# Patient Record
Sex: Female | Born: 1955 | Race: White | Hispanic: No | Smoking: Never smoker
Health system: Southern US, Community
[De-identification: ages and names within clinical notes are randomized; demographics above are authoritative.]

## PROBLEM LIST (undated history)

## (undated) DIAGNOSIS — I5020 Unspecified systolic (congestive) heart failure: Secondary | ICD-10-CM

## (undated) DIAGNOSIS — I214 Non-ST elevation (NSTEMI) myocardial infarction: Secondary | ICD-10-CM

## (undated) DIAGNOSIS — E7800 Pure hypercholesterolemia, unspecified: Secondary | ICD-10-CM

## (undated) DIAGNOSIS — E039 Hypothyroidism, unspecified: Secondary | ICD-10-CM

## (undated) DIAGNOSIS — G4733 Obstructive sleep apnea (adult) (pediatric): Secondary | ICD-10-CM

## (undated) DIAGNOSIS — F32A Depression, unspecified: Secondary | ICD-10-CM

## (undated) DIAGNOSIS — Z9989 Dependence on other enabling machines and devices: Secondary | ICD-10-CM

## (undated) DIAGNOSIS — E079 Disorder of thyroid, unspecified: Secondary | ICD-10-CM

## (undated) HISTORY — PX: JOINT REPLACEMENT: SHX530

## (undated) HISTORY — PX: EYE SURGERY: SHX253

## (undated) HISTORY — PX: CARDIAC CATHETERIZATION: SHX172

## (undated) HISTORY — PX: CARPAL TUNNEL RELEASE: SHX101

## (undated) HISTORY — PX: SEPTOPLASTY: SUR1290

## (undated) HISTORY — PX: ARTHROPLASTY: SHX135

---

## 2006-10-28 ENCOUNTER — Emergency Department (HOSPITAL_COMMUNITY): Admission: EM | Admit: 2006-10-28 | Discharge: 2006-10-28 | Payer: Self-pay | Admitting: Emergency Medicine

## 2020-08-27 DIAGNOSIS — M79645 Pain in left finger(s): Secondary | ICD-10-CM | POA: Diagnosis not present

## 2020-09-22 DIAGNOSIS — E7800 Pure hypercholesterolemia, unspecified: Secondary | ICD-10-CM | POA: Diagnosis not present

## 2020-09-22 DIAGNOSIS — E039 Hypothyroidism, unspecified: Secondary | ICD-10-CM | POA: Diagnosis not present

## 2020-10-08 DIAGNOSIS — M79645 Pain in left finger(s): Secondary | ICD-10-CM | POA: Diagnosis not present

## 2020-12-03 DIAGNOSIS — G4733 Obstructive sleep apnea (adult) (pediatric): Secondary | ICD-10-CM | POA: Diagnosis not present

## 2020-12-03 DIAGNOSIS — F411 Generalized anxiety disorder: Secondary | ICD-10-CM | POA: Diagnosis not present

## 2020-12-03 DIAGNOSIS — H9319 Tinnitus, unspecified ear: Secondary | ICD-10-CM | POA: Diagnosis not present

## 2020-12-03 DIAGNOSIS — E7800 Pure hypercholesterolemia, unspecified: Secondary | ICD-10-CM | POA: Diagnosis not present

## 2020-12-03 DIAGNOSIS — Z1211 Encounter for screening for malignant neoplasm of colon: Secondary | ICD-10-CM | POA: Diagnosis not present

## 2020-12-03 DIAGNOSIS — E039 Hypothyroidism, unspecified: Secondary | ICD-10-CM | POA: Diagnosis not present

## 2020-12-03 DIAGNOSIS — Z6826 Body mass index (BMI) 26.0-26.9, adult: Secondary | ICD-10-CM | POA: Diagnosis not present

## 2020-12-03 DIAGNOSIS — M1990 Unspecified osteoarthritis, unspecified site: Secondary | ICD-10-CM | POA: Diagnosis not present

## 2020-12-03 DIAGNOSIS — Z79899 Other long term (current) drug therapy: Secondary | ICD-10-CM | POA: Diagnosis not present

## 2020-12-05 DIAGNOSIS — H401233 Low-tension glaucoma, bilateral, severe stage: Secondary | ICD-10-CM | POA: Diagnosis not present

## 2020-12-09 DIAGNOSIS — F329 Major depressive disorder, single episode, unspecified: Secondary | ICD-10-CM | POA: Diagnosis not present

## 2020-12-09 DIAGNOSIS — E039 Hypothyroidism, unspecified: Secondary | ICD-10-CM | POA: Diagnosis not present

## 2020-12-09 DIAGNOSIS — G4733 Obstructive sleep apnea (adult) (pediatric): Secondary | ICD-10-CM | POA: Diagnosis not present

## 2020-12-10 ENCOUNTER — Other Ambulatory Visit: Payer: Self-pay | Admitting: Family Medicine

## 2020-12-10 DIAGNOSIS — Z1231 Encounter for screening mammogram for malignant neoplasm of breast: Secondary | ICD-10-CM

## 2021-01-08 ENCOUNTER — Other Ambulatory Visit: Payer: Self-pay

## 2021-01-08 ENCOUNTER — Inpatient Hospital Stay (HOSPITAL_COMMUNITY)
Admission: EM | Admit: 2021-01-08 | Discharge: 2021-01-10 | DRG: 246 | Disposition: A | Discharge status: Home / Self Care | Payer: Medicare PPO | Attending: Internal Medicine | Admitting: Internal Medicine

## 2021-01-08 ENCOUNTER — Encounter (HOSPITAL_COMMUNITY): Payer: Self-pay

## 2021-01-08 ENCOUNTER — Emergency Department (HOSPITAL_COMMUNITY): Payer: Medicare PPO

## 2021-01-08 DIAGNOSIS — I255 Ischemic cardiomyopathy: Secondary | ICD-10-CM

## 2021-01-08 DIAGNOSIS — Z96653 Presence of artificial knee joint, bilateral: Secondary | ICD-10-CM | POA: Diagnosis not present

## 2021-01-08 DIAGNOSIS — E785 Hyperlipidemia, unspecified: Secondary | ICD-10-CM | POA: Diagnosis not present

## 2021-01-08 DIAGNOSIS — F32A Depression, unspecified: Secondary | ICD-10-CM | POA: Diagnosis present

## 2021-01-08 DIAGNOSIS — I252 Old myocardial infarction: Secondary | ICD-10-CM | POA: Diagnosis not present

## 2021-01-08 DIAGNOSIS — Z8249 Family history of ischemic heart disease and other diseases of the circulatory system: Secondary | ICD-10-CM

## 2021-01-08 DIAGNOSIS — R079 Chest pain, unspecified: Secondary | ICD-10-CM

## 2021-01-08 DIAGNOSIS — Z955 Presence of coronary angioplasty implant and graft: Secondary | ICD-10-CM

## 2021-01-08 DIAGNOSIS — H409 Unspecified glaucoma: Secondary | ICD-10-CM | POA: Diagnosis present

## 2021-01-08 DIAGNOSIS — I2510 Atherosclerotic heart disease of native coronary artery without angina pectoris: Secondary | ICD-10-CM | POA: Diagnosis not present

## 2021-01-08 DIAGNOSIS — I200 Unstable angina: Secondary | ICD-10-CM | POA: Diagnosis present

## 2021-01-08 DIAGNOSIS — R9431 Abnormal electrocardiogram [ECG] [EKG]: Secondary | ICD-10-CM | POA: Diagnosis not present

## 2021-01-08 DIAGNOSIS — R6884 Jaw pain: Secondary | ICD-10-CM | POA: Diagnosis not present

## 2021-01-08 DIAGNOSIS — E7800 Pure hypercholesterolemia, unspecified: Secondary | ICD-10-CM | POA: Diagnosis not present

## 2021-01-08 DIAGNOSIS — I5021 Acute systolic (congestive) heart failure: Secondary | ICD-10-CM

## 2021-01-08 DIAGNOSIS — Z79890 Hormone replacement therapy (postmenopausal): Secondary | ICD-10-CM | POA: Diagnosis not present

## 2021-01-08 DIAGNOSIS — Z79899 Other long term (current) drug therapy: Secondary | ICD-10-CM

## 2021-01-08 DIAGNOSIS — G4733 Obstructive sleep apnea (adult) (pediatric): Secondary | ICD-10-CM | POA: Diagnosis present

## 2021-01-08 DIAGNOSIS — I351 Nonrheumatic aortic (valve) insufficiency: Secondary | ICD-10-CM | POA: Diagnosis not present

## 2021-01-08 DIAGNOSIS — I25110 Atherosclerotic heart disease of native coronary artery with unstable angina pectoris: Secondary | ICD-10-CM | POA: Diagnosis not present

## 2021-01-08 DIAGNOSIS — R0789 Other chest pain: Secondary | ICD-10-CM | POA: Diagnosis not present

## 2021-01-08 DIAGNOSIS — Z20822 Contact with and (suspected) exposure to covid-19: Secondary | ICD-10-CM | POA: Diagnosis not present

## 2021-01-08 DIAGNOSIS — Z9861 Coronary angioplasty status: Secondary | ICD-10-CM | POA: Diagnosis not present

## 2021-01-08 DIAGNOSIS — Z888 Allergy status to other drugs, medicaments and biological substances status: Secondary | ICD-10-CM | POA: Diagnosis not present

## 2021-01-08 DIAGNOSIS — E079 Disorder of thyroid, unspecified: Secondary | ICD-10-CM | POA: Diagnosis present

## 2021-01-08 DIAGNOSIS — I959 Hypotension, unspecified: Secondary | ICD-10-CM | POA: Diagnosis not present

## 2021-01-08 DIAGNOSIS — I214 Non-ST elevation (NSTEMI) myocardial infarction: Principal | ICD-10-CM | POA: Diagnosis present

## 2021-01-08 HISTORY — DX: Unspecified systolic (congestive) heart failure: I50.20

## 2021-01-08 HISTORY — DX: Obstructive sleep apnea (adult) (pediatric): G47.33

## 2021-01-08 HISTORY — DX: Non-ST elevation (NSTEMI) myocardial infarction: I21.4

## 2021-01-08 HISTORY — DX: Hypothyroidism, unspecified: E03.9

## 2021-01-08 HISTORY — DX: Disorder of thyroid, unspecified: E07.9

## 2021-01-08 HISTORY — DX: Dependence on other enabling machines and devices: Z99.89

## 2021-01-08 HISTORY — DX: Depression, unspecified: F32.A

## 2021-01-08 HISTORY — DX: Pure hypercholesterolemia, unspecified: E78.00

## 2021-01-08 LAB — HEPARIN LEVEL (UNFRACTIONATED): Heparin Unfractionated: 0.44 [IU]/mL (ref 0.30–0.70)

## 2021-01-08 LAB — CBC
HCT: 42.4 % (ref 36.0–46.0)
Hemoglobin: 14.2 g/dL (ref 12.0–15.0)
MCH: 30.3 pg (ref 26.0–34.0)
MCHC: 33.5 g/dL (ref 30.0–36.0)
MCV: 90.4 fL (ref 80.0–100.0)
Platelets: 177 K/uL (ref 150–400)
RBC: 4.69 MIL/uL (ref 3.87–5.11)
RDW: 12.2 % (ref 11.5–15.5)
WBC: 6.6 K/uL (ref 4.0–10.5)
nRBC: 0 % (ref 0.0–0.2)

## 2021-01-08 LAB — BASIC METABOLIC PANEL
Anion gap: 9 (ref 5–15)
BUN: 11 mg/dL (ref 8–23)
CO2: 24 mmol/L (ref 22–32)
Calcium: 9.1 mg/dL (ref 8.9–10.3)
Chloride: 106 mmol/L (ref 98–111)
Creatinine, Ser: 0.64 mg/dL (ref 0.44–1.00)
GFR, Estimated: >60 mL/min (ref >60)
Glucose, Bld: 91 mg/dL (ref 70–99)
Potassium: 4.3 mmol/L (ref 3.5–5.1)
Sodium: 139 mmol/L (ref 135–145)

## 2021-01-08 LAB — RESP PANEL BY RT-PCR (FLU A&B, COVID) ARPGX2
Influenza A by PCR: NEGATIVE
Influenza B by PCR: NEGATIVE
SARS Coronavirus 2 by RT PCR: NEGATIVE

## 2021-01-08 LAB — TROPONIN I (HIGH SENSITIVITY)
Troponin I (High Sensitivity): 1265 ng/L
Troponin I (High Sensitivity): 1526 ng/L

## 2021-01-08 MED ORDER — TIMOLOL MALEATE 0.5 % OP SOLN
1.0000 [drp] | Freq: Two times a day (BID) | OPHTHALMIC | Status: DC
  Administered 2021-01-08 – 2021-01-10 (×4): 1 [drp] via OPHTHALMIC
  Filled 2021-01-08 (×2): qty 5

## 2021-01-08 MED ORDER — LEVOTHYROXINE SODIUM 75 MCG PO TABS
150.0000 ug | ORAL_TABLET | Freq: Every day | ORAL | Status: DC
  Administered 2021-01-09 – 2021-01-10 (×2): 150 ug via ORAL
  Filled 2021-01-08 (×2): qty 2

## 2021-01-08 MED ORDER — ASPIRIN EC 81 MG PO TBEC
81.0000 mg | DELAYED_RELEASE_TABLET | Freq: Every day | ORAL | Status: DC
  Administered 2021-01-09 – 2021-01-10 (×2): 81 mg via ORAL
  Filled 2021-01-08 (×3): qty 1

## 2021-01-08 MED ORDER — SERTRALINE HCL 100 MG PO TABS
100.0000 mg | ORAL_TABLET | Freq: Every day | ORAL | Status: DC
  Administered 2021-01-08 – 2021-01-09 (×2): 100 mg via ORAL
  Filled 2021-01-08 (×3): qty 1

## 2021-01-08 MED ORDER — NITROGLYCERIN 0.4 MG SL SUBL
0.4000 mg | SUBLINGUAL_TABLET | SUBLINGUAL | Status: DC | PRN
  Administered 2021-01-09: 0.4 mg via SUBLINGUAL
  Filled 2021-01-08: qty 1

## 2021-01-08 MED ORDER — METOPROLOL TARTRATE 12.5 MG HALF TABLET
12.5000 mg | ORAL_TABLET | Freq: Two times a day (BID) | ORAL | Status: DC
  Administered 2021-01-08 – 2021-01-09 (×3): 12.5 mg via ORAL
  Filled 2021-01-08 (×3): qty 1

## 2021-01-08 MED ORDER — HYDROXYZINE HCL 25 MG PO TABS: 25.0000 mg | ORAL_TABLET | ORAL | Status: DC | PRN

## 2021-01-08 MED ORDER — HEPARIN (PORCINE) 25000 UT/250ML-% IV SOLN
800.0000 [IU]/h | INTRAVENOUS | Status: DC
  Administered 2021-01-08: 800 [IU]/h via INTRAVENOUS
  Filled 2021-01-08: qty 250

## 2021-01-08 MED ORDER — ONDANSETRON HCL 4 MG/2ML IJ SOLN: 4.0000 mg | Freq: Four times a day (QID) | INTRAMUSCULAR | Status: DC | PRN

## 2021-01-08 MED ORDER — HEPARIN BOLUS VIA INFUSION
4000.0000 [IU] | Freq: Once | INTRAVENOUS | Status: AC
  Administered 2021-01-08: 4000 [IU] via INTRAVENOUS
  Filled 2021-01-08: qty 4000

## 2021-01-08 MED ORDER — LEVOTHYROXINE SODIUM 25 MCG PO TABS: 150.0000 ug | ORAL_TABLET | Freq: Every day | ORAL | Status: DC

## 2021-01-08 MED ORDER — ACETAMINOPHEN 325 MG PO TABS: 650.0000 mg | ORAL_TABLET | ORAL | Status: DC | PRN

## 2021-01-08 MED ORDER — ATORVASTATIN CALCIUM 80 MG PO TABS
80.0000 mg | ORAL_TABLET | Freq: Every day | ORAL | Status: DC
  Administered 2021-01-08 – 2021-01-10 (×3): 80 mg via ORAL
  Filled 2021-01-08 (×3): qty 1

## 2021-01-08 NOTE — ED Notes (Signed)
Pt to Xray via stretcher in stable condition

## 2021-01-08 NOTE — ED Provider Notes (Signed)
[facility] EMERGENCY DEPARTMENT Provider Note   CSN: [id number] Arrival date & time: 01/08/21  1414     History Chief Complaint  Patient presents with  . Chest Pain    Debbie Frazier is a 65 y.o. female.  HPI     65yo female with history of depression, hyperlipidemia, presents with concern for chest pain.  Reports that she had jaw pain at 330 this morning then chest tightness at 6 AM.  She had an EKG done this morning, went to an appointment and had another EKG at her PCP and was sent to the emergency department for evaluation.  Reports at 330 this morning she woke up with jaw pain, an aching pain, and initially thought it was related to her BiPAP and her invisible line and removed with ease.  She continued to have waxing and waning episodes of discomfort, and around 6 AM the discomfort became more intense, describing a tightness in the center of her chest, with some radiation down the back of her bilateral arms and to her jaw bilaterally. Felt some indigestion. She had some associated shortness of breath.  Denies nausea, diaphoresis, lightheadedness. Felt the discomfort was exertional at the time, had not had any previous exertional discomfort prior to this morning.  Denies any leg pain or swelling, recent surgery, travel, or immobilization, history of DVT or PE.  Her father does have a cardiac history, although she is unclear at what age she was initially diagnosed.  He had coronary bypass as well as an aneurysm.    Denies history of smoking or drug use.  Reports alcohol use, approximately 1-2 drinks per day.  Past Medical History:  Diagnosis Date  . Depression   . High cholesterol   . NSTEMI (non-ST elevated myocardial infarction) (HCC)   . OSA on CPAP   . Thyroid disease     Patient Active Problem List   Diagnosis Date Noted  . Unstable angina (HCC) 01/08/2021    Past Surgical History:  Procedure Laterality Date  . ARTHROPLASTY    . CARPAL  TUNNEL RELEASE Right   . EYE SURGERY    . JOINT REPLACEMENT    . SEPTOPLASTY       OB History    Gravida  4   Para  2   Term      Preterm      AB  2   Living  2     SAB      IAB      Ectopic      Multiple      Live Births  2           Family History  Problem Relation Age of Onset  . CAD Father   . CAD Maternal Grandfather     Social History   Tobacco Use  . Smoking status: Never Smoker  . Smokeless tobacco: Never Used  Vaping Use  . Vaping Use: Never used  Substance Use Topics  . Alcohol use: Yes    Alcohol/week: 12.0 standard drinks    Types: 12 Cans of beer per week    Home Medications Prior to Admission medications   Medication Sig Start Date End Date Taking? Authorizing Provider  acyclovir (ZOVIRAX) 200 MG capsule Take 20 mg by mouth as needed (flare).   Yes [provider]  atorvastatin (LIPITOR) 10 MG tablet Take 10 mg by mouth at bedtime. 12/15/20  Yes [provider]  clobetasol (TEMOVATE) 0.05 % external solution  Apply 1 application topically as needed (scalp). 07/30/20  Yes [provider]  fluorometholone (FML) 0.1 % ophthalmic suspension Place 1 drop into both eyes as needed (irritation). 07/30/20  Yes [provider]  hydrOXYzine (ATARAX/VISTARIL) 25 MG tablet Take 25 mg by mouth as needed for anxiety. 07/29/20  Yes [provider]  sertraline (ZOLOFT) 100 MG tablet Take 100 mg by mouth at bedtime. 12/15/20  Yes [provider]  SYNTHROID 150 MCG tablet Take 150 mcg by mouth at bedtime. 11/10/20  Yes [provider]  timolol (TIMOPTIC) 0.5 % ophthalmic solution Place 1 drop into the left eye 2 (two) times daily. 11/10/20  Yes [provider]  celecoxib (CELEBREX) 200 MG capsule Take 200 mg by mouth daily as needed. Patient not taking: No sig reported 11/11/20   [provider]    Allergies    Brimonidine  Review of Systems   Review of Systems  Constitutional:  Negative for fever.  HENT: Negative for sore throat.   Eyes: Negative for visual disturbance.  Respiratory: Positive for shortness of breath. Negative for cough.   Cardiovascular: Positive for chest pain.  Gastrointestinal: Negative for abdominal pain, nausea and vomiting.  Genitourinary: Negative for difficulty urinating.  Musculoskeletal: Negative for back pain and neck pain.  Skin: Negative for rash.  Neurological: Negative for syncope and headaches.    Physical Exam Updated Vital Signs BP 124/65   Pulse 71   Temp 98.1 F (36.7 C) (Oral)   Resp 18   Ht 5\' 5"  (1.651 m)   Wt 69.9 kg   SpO2 94%   BMI 25.63 kg/m   Physical Exam Vitals and nursing note reviewed.  Constitutional:      General: She is not in acute distress.    Appearance: She is well-developed. She is not diaphoretic.  HENT:     Head: Normocephalic and atraumatic.  Eyes:     Conjunctiva/sclera: Conjunctivae normal.  Cardiovascular:     Rate and Rhythm: Normal rate and regular rhythm.     Heart sounds: Normal heart sounds. No murmur heard. No friction rub. No gallop.   Pulmonary:     Effort: Pulmonary effort is normal. No respiratory distress.     Breath sounds: Normal breath sounds. No wheezing or rales.  Abdominal:     General: There is no distension.     Palpations: Abdomen is soft.     Tenderness: There is no abdominal tenderness. There is no guarding.  Musculoskeletal:        General: No tenderness.     Cervical back: Normal range of motion.  Skin:    General: Skin is warm and dry.     Findings: No erythema or rash.  Neurological:     Mental Status: She is alert and oriented to person, place, and time.     ED Results / Procedures / Treatments   Labs (all labs ordered are listed, but only abnormal results are displayed) Labs Reviewed  TROPONIN I (HIGH SENSITIVITY) - Abnormal; Notable for the following components:      Result Value   Troponin I (High Sensitivity) 1,265 (*)    All other  components within normal limits  TROPONIN I (HIGH SENSITIVITY) - Abnormal; Notable for the following components:   Troponin I (High Sensitivity) 1,526 (*)    All other components within normal limits  RESP PANEL BY RT-PCR (FLU A&B, COVID) ARPGX2  BASIC METABOLIC PANEL  CBC  HEPARIN LEVEL (UNFRACTIONATED)  HIV ANTIBODY (ROUTINE TESTING  W REFLEX)  BASIC METABOLIC PANEL  LIPID PANEL  CBC  HEPARIN LEVEL (UNFRACTIONATED)    EKG EKG Interpretation  Date/Time:  Thursday January 08 2021 14:29:27 EDT Ventricular Rate:  69 PR Interval:  146 QRS Duration: 82 QT Interval:  348 QTC Calculation: 373 R Axis:   72 Text Interpretation: Sinus rhythm Consider left atrial enlargement no sig change from previous Confirmed by Arby Barrette [phone]) on 01/08/2021 2:33:07 PM   Radiology DG Chest 2 View  Result Date: 01/08/2021 CLINICAL DATA:  Chest pain. EXAM: CHEST - 2 VIEW COMPARISON:  No prior. FINDINGS: Mediastinum hilar structures normal. Heart size normal. No focal infiltrate. No pleural effusion or pneumothorax. Degenerative change thoracic spine. IMPRESSION: No acute cardiopulmonary disease. Electronically Signed   By: Maisie Fus  Register   On: 01/08/2021 15:29    Procedures .Critical Care Performed by: Alvira Monday, MD Authorized by: Alvira Monday, MD   Critical care provider statement:    Critical care time (minutes):  30   Critical care was time spent personally by me on the following activities:  Discussions with consultants, evaluation of patient's response to treatment, examination of patient, ordering and performing treatments and interventions, ordering and review of laboratory studies, ordering and review of radiographic studies, pulse oximetry, re-evaluation of patient's condition, obtaining history from patient or surrogate and review of old charts     Medications Ordered in ED Medications  heparin ADULT infusion 100 units/mL (25000 units/244mL) (800 Units/hr Intravenous  Handoff 01/08/21 1900)    And  heparin bolus via infusion 4,000 Units (4,000 Units Intravenous Bolus from Bag 01/08/21 1709)  aspirin EC tablet 81 mg (has no administration in time range)  nitroGLYCERIN (NITROSTAT) SL tablet 0.4 mg (has no administration in time range)  acetaminophen (TYLENOL) tablet 650 mg (has no administration in time range)  ondansetron (ZOFRAN) injection 4 mg (has no administration in time range)  metoprolol tartrate (LOPRESSOR) tablet 12.5 mg (12.5 mg Oral Given 01/08/21 2222)  atorvastatin (LIPITOR) tablet 80 mg (80 mg Oral Given 01/08/21 2222)  hydrOXYzine (ATARAX/VISTARIL) tablet 25 mg (has no administration in time range)  sertraline (ZOLOFT) tablet 100 mg (100 mg Oral Given 01/08/21 2222)  timolol (TIMOPTIC) 0.5 % ophthalmic solution 1 drop (1 drop Left Eye Given 01/08/21 2222)  levothyroxine (SYNTHROID) tablet 150 mcg (has no administration in time range)    ED Course  I have reviewed the triage vital signs and the nursing notes.  Pertinent labs & imaging results that were available during my care of the patient were reviewed by me and considered in my medical decision making (see chart for details).    MDM Rules/Calculators/A&P                          65yo female with history of depression, hyperlipidemia, presents with concern for chest pain.  Chest x-ray Vedre by me and showed no sign of pneumothorax, pneumonia, pulmonary edema.  EKG evaluated by me and showed no sign of ST elevations, does show T wave inversions.  History and exam are not consistent with pulmonary embolus or aortic dissection.  Troponin elevated to 1265, feel that history, exam and troponin elevation are consistent with an NSTEMI.  Received aspirin prior to arrival, placed on heparin drip and consulted cardiology for admission. She is chest pain free at this time.    Final Clinical Impression(s) / ED Diagnoses Final diagnoses:  Nonspecific chest pain    Rx / DC Orders ED Discharge  Orders    None       Alvira MondaySchlossman, Reuel Lamadrid, MD 01/09/21 0127

## 2021-01-08 NOTE — H&P (Signed)
Cardiology Admission History and Physical:   Patient ID: Debbie Frazier MRN: [id number]; DOB: [birth year]   Admission date: 01/08/2021  PCP:  Sigmund Hazel, MD   [facility]  Cardiologist:  Chrystie Nose, MD new  Chief Complaint:  NSTEMI  Patient Profile:   Debbie Frazier is a 65 y.o. female with a history of OSA on CPAP, depression, glaucoma, and HLD presented with chest pain found to have NSTEMI.  History of Present Illness:   Ms. Bacigalupo with the above PMH presented to [facility] jaw pain starting at 0330 and chest tightness at 0600. EMS dispatched and comopleted a 12 lead EKG. She declined transport to the ER because the medic told her it wasn't her heart. She had another 12 lead at her PCP's office that showed TWI who sent her to the ER. She was chest pain free. On arrival, EKG shows TWI anterior leads. HS troponin resulted at 1265. Heparin drip was started and cardiology called for admission.   During my interview, she recaps the above. She has never had jaw or chest pain in the past. She has no prior cardiac history. She is a nonsmoker and is not diabetic. She is compliant on CPAP for OSA.   She is retired from [employer]. She has grown children. She lives at home. She denies illicit drug use. She does drink alcohol  (12 beers per week). She does report heart disease in her father - CABG in his ?60s, heart disease in grandparents. She has had bilateral knee replacements, three foot surgeries, carpal tunnel surgery in her right wrist, and 2 D&C's.    Past Medical History:  Diagnosis Date  . Depression   . High cholesterol   . NSTEMI (non-ST elevated myocardial infarction) (HCC)   . OSA on CPAP   . Thyroid disease     Past Surgical History:  Procedure Laterality Date  . ARTHROPLASTY    . CARPAL TUNNEL RELEASE Right   . EYE SURGERY    . JOINT REPLACEMENT    . SEPTOPLASTY       Medications Prior to Admission: Prior to  Admission medications   Medication Sig Start Date End Date Taking? Authorizing Provider  acyclovir (ZOVIRAX) 200 MG capsule Take 20 mg by mouth as needed (flare).   Yes [provider]  atorvastatin (LIPITOR) 10 MG tablet Take 10 mg by mouth at bedtime. 12/15/20  Yes [provider]  clobetasol (TEMOVATE) 0.05 % external solution Apply 1 application topically as needed (scalp). 07/30/20  Yes [provider]  fluorometholone (FML) 0.1 % ophthalmic suspension Place 1 drop into both eyes as needed (irritation). 07/30/20  Yes [provider]  hydrOXYzine (ATARAX/VISTARIL) 25 MG tablet Take 25 mg by mouth as needed for anxiety. 07/29/20  Yes [provider]  sertraline (ZOLOFT) 100 MG tablet Take 100 mg by mouth at bedtime. 12/15/20  Yes [provider]  SYNTHROID 150 MCG tablet Take 150 mcg by mouth at bedtime. 11/10/20  Yes [provider]  timolol (TIMOPTIC) 0.5 % ophthalmic solution Place 1 drop into the left eye 2 (two) times daily. 11/10/20  Yes [provider]  celecoxib (CELEBREX) 200 MG capsule Take 200 mg by mouth daily as needed. Patient not taking: No sig reported 11/11/20   [provider]     Allergies:    Allergies  Allergen Reactions  . Brimonidine Other (See Comments)    Pt was not able to tolerate  Social History:   Social History   Socioeconomic History  . Marital status: Married    Spouse name: Not on file  . Number of children: Not on file  . Years of education: Not on file  . Highest education level: Not on file  Occupational History  . Not on file  Tobacco Use  . Smoking status: Never Smoker  . Smokeless tobacco: Never Used  Vaping Use  . Vaping Use: Never used  Substance and Sexual Activity  . Alcohol use: Yes    Alcohol/week: 12.0 standard drinks    Types: 12 Cans of beer per week  . Drug use: Not on file  . Sexual activity: Not on file  Other Topics Concern  . Not on file   Social History Narrative  . Not on file   Social Determinants of Health   Financial Resource Strain: Not on file  Food Insecurity: Not on file  Transportation Needs: Not on file  Physical Activity: Not on file  Stress: Not on file  Social Connections: Not on file  Intimate Partner Violence: Not on file    Family History:   The patient's family history includes CAD in her father and maternal grandfather.    ROS:  Please see the history of present illness.  All other ROS reviewed and negative.     Physical Exam/Data:   Vitals:   01/08/21 1422 01/08/21 1430 01/08/21 1500 01/08/21 1610  BP:  129/69 118/76 137/84  Pulse:  70 69 71  Resp:  14 18 16   Temp:  98.7 F (37.1 C)    TempSrc:  Oral    SpO2:  97% 96% 96%  Weight: 69.9 kg     Height: 5\' 5"  (1.651 m)      No intake or output data in the 24 hours ending 01/08/21 1714 Last 3 Weights 01/08/2021  Weight (lbs) 154 lb  Weight (kg) 69.854 kg     Body mass index is 25.63 kg/m.  General:  Well nourished, well developed, in no acute distress HEENT: normal Lymph: no adenopathy Neck: no JVD Endocrine:  No thryomegaly Vascular: No carotid bruits Cardiac:  normal S1, S2; RRR; no murmur  Lungs:  clear to auscultation bilaterally, no wheezing, rhonchi or rales  Abd: soft, nontender, no hepatomegaly  Ext: no edema Musculoskeletal:  No deformities, BUE and BLE strength normal and equal Skin: warm and dry  Neuro:  CNs 2-12 intact, no focal abnormalities noted Psych:  Normal affect    EKG:  The ECG that was done was personally reviewed and demonstrates sinus rhythm HR 69 TWI anterior leads  Relevant CV Studies:  none  Laboratory Data:  High Sensitivity Troponin:   Recent Labs  Lab 01/08/21 1442  TROPONINIHS 1,265*      Chemistry Recent Labs  Lab 01/08/21 1442  NA 139  K 4.3  CL 106  CO2 24  GLUCOSE 91  BUN 11  CREATININE 0.64  CALCIUM 9.1  GFRNONAA >60  ANIONGAP 9    No results for input(s): PROT,  ALBUMIN, AST, ALT, ALKPHOS, BILITOT in the last 168 hours. Hematology Recent Labs  Lab 01/08/21 1442  WBC 6.6  RBC 4.69  HGB 14.2  HCT 42.4  MCV 90.4  MCH 30.3  MCHC 33.5  RDW 12.2  PLT 177   BNPNo results for input(s): BNP, PROBNP in the last 168 hours.  DDimer No results for input(s): DDIMER in the last 168 hours.   Radiology/Studies:  DG Chest 2 View  Result Date: 01/08/2021 CLINICAL DATA:  Chest pain. EXAM: CHEST - 2 VIEW COMPARISON:  No prior. FINDINGS: Mediastinum hilar structures normal. Heart size normal. No focal infiltrate. No pleural effusion or pneumothorax. Degenerative change thoracic spine. IMPRESSION: No acute cardiopulmonary disease. Electronically Signed   By: Maisie Fus  Register   On: 01/08/2021 15:29     Assessment and Plan:   NSTEMI - hs troponin 1265, delta pending - heparin drip started - will also add ASA, and low dose BB, increase statin as below - plan for left heart cath tomorrow - will obtain echo today - if chest pain returns, may need to consider heart cath tonight   Hyperlipidemia - will collect lipid panel tomorrow morning - will increase home 10 mg lipitor to 80 mg   OSA - compliant on CPAP   Alcohol use - does drink weekly - 12 beers weekly   Admit to cardiology. Will plan for heart catheterization tomorrow. Will obtain an echocardiogram tonight.    Risk Assessment/Risk Scores:       :[id number]}   TIMI Risk Score for Unstable Angina or Non-ST Elevation MI:   The patient's TIMI risk score is 3, which indicates a 13% risk of all cause mortality, new or recurrent myocardial infarction or need for urgent revascularization in the next 14 days.{      Severity of Illness: The appropriate patient status for this patient is INPATIENT. Inpatient status is judged to be reasonable and necessary in order to provide the required intensity of service to ensure the patient's safety. The patient's presenting symptoms, physical exam findings,  and initial radiographic and laboratory data in the context of their chronic comorbidities is felt to place them at high risk for further clinical deterioration. Furthermore, it is not anticipated that the patient will be medically stable for discharge from the hospital within 2 midnights of admission. The following factors support the patient status of inpatient.   " The patient's presenting symptoms include angina. " The worrisome physical exam findings include angina. " The initial radiographic and laboratory data are worrisome because of TWI. " The chronic co-morbidities include HLD.   * I certify that at the point of admission it is my clinical judgment that the patient will require inpatient hospital care spanning beyond 2 midnights from the point of admission due to high intensity of service, high risk for further deterioration and high frequency of surveillance required.*    For questions or updates, please contact [facility] Please consult [email or web address] for contact info under     Signed, Marcelino Duster, [place]  01/08/2021 5:14 PM

## 2021-01-08 NOTE — ED Notes (Signed)
Cardiology at bedside.

## 2021-01-08 NOTE — Progress Notes (Signed)
ANTICOAGULATION CONSULT NOTE - Initial Consult  Pharmacy Consult for heparin dosing.  Indication: chest pain/ACS  Allergies  Allergen Reactions  . Brimonidine Other (See Comments)    Pt was not able to tolerate     Patient Measurements: Height: 5\' 5"  (165.1 cm) Weight: 69.9 kg (154 lb) IBW/kg (Calculated) : 57 Heparin Dosing Weight: 69.9 kg  Vital Signs: Temp: 98.7 F (37.1 C) (04/28 1430) Temp Source: Oral (04/28 1430) BP: 118/76 (04/28 1500) Pulse Rate: 69 (04/28 1500)  Labs: Recent Labs    01/08/21 1442  HGB 14.2  HCT 42.4  PLT 177  CREATININE 0.64  TROPONINIHS 1,265*    Estimated Creatinine Clearance: 67.9 mL/min (by C-G formula based on SCr of 0.64 mg/dL).   Medical History: Past Medical History:  Diagnosis Date  . Depression   . High cholesterol   . Thyroid disease     Assessment: 65 y.o. female presenting with chest pain. Not on AC PTA. Initial CBC wnl, Scr <1, Troponin elevated at 1265. Pharmacy has been consulted to dose IV heparin.   Goal of Therapy:  Heparin level 0.3-0.7 units/ml Monitor platelets by anticoagulation protocol: Yes   Plan:  Give 4000 units bolus x 1 Start heparin infusion at 800 units/hr Check anti-Xa level in 6 hours and daily while on heparin Continue to monitor H&H and platelets  71, PharmD PGY1 Acute Care Pharmacy Resident 01/08/2021 4:09 PM  Please check [email or web address] for unit-specific pharmacy phone numbers.

## 2021-01-08 NOTE — ED Notes (Signed)
Lab called with critical trop of 1265. Dr. Dalene Seltzer notified of elevated Trop.

## 2021-01-08 NOTE — Progress Notes (Signed)
ANTICOAGULATION CONSULT NOTE  Pharmacy Consult for heparin dosing.  Indication: chest pain/ACS  Allergies  Allergen Reactions  . Brimonidine Other (See Comments)    Pt was not able to tolerate     Patient Measurements: Height: 5\' 5"  (165.1 cm) Weight: 69.9 kg (154 lb) IBW/kg (Calculated) : 57 Heparin Dosing Weight: 69.9 kg  Vital Signs: Temp: 98.1 F (36.7 C) (04/28 2005) Temp Source: Oral (04/28 2005) BP: 124/65 (04/28 2222) Pulse Rate: 71 (04/28 2222)  Labs: Recent Labs    01/08/21 1442 01/08/21 1817 01/08/21 2240  HGB 14.2  --   --   HCT 42.4  --   --   PLT 177  --   --   HEPARINUNFRC  --   --  0.44  CREATININE 0.64  --   --   TROPONINIHS 1,265* 1,526*  --     Estimated Creatinine Clearance: 67.9 mL/min (by C-G formula based on SCr of 0.64 mg/dL).    Assessment: 65 y.o. female presenting with chest pain. Not on AC PTA. Initial CBC wnl, Scr <1, Troponin elevated at 1265. Pharmacy has been consulted to dose IV heparin.   Heparin level therapeutic (0.44) on gtt at 800 units/hr. No bleeding noted. Plan for L heart cath in a.m.  Goal of Therapy:  Heparin level 0.3-0.7 units/ml Monitor platelets by anticoagulation protocol: Yes   Plan:  Continue heparin at 800 units/hr Will f/u daily heparin level  71, PharmD, BCPS Please see amion for complete clinical pharmacist phone list 01/08/2021 11:14 PM

## 2021-01-08 NOTE — ED Triage Notes (Signed)
[facility] from [facility], Pt felt jaw pain at 0330 this morning, then chest tightness @ 0600. EMS did 12 lead and pt refused transport after MEDIC said it was not her heart. Went to appt, another 12 lead showed T wave inversion. Pt sent here to be evaluated. Denies CP at this time. MEDIC gave 324 ASA.

## 2021-01-08 NOTE — ED Notes (Addendum)
Pt had chest tightness at 0600. Denies chest pain or tightness at this time. Pt also denies shortness of breath or any other complaints.

## 2021-01-09 ENCOUNTER — Encounter (HOSPITAL_COMMUNITY)
Admission: EM | Disposition: A | Discharge status: Home / Self Care | Payer: Self-pay | Source: Home / Self Care | Attending: Internal Medicine

## 2021-01-09 ENCOUNTER — Encounter (HOSPITAL_COMMUNITY): Payer: Self-pay | Admitting: Interventional Cardiology

## 2021-01-09 ENCOUNTER — Inpatient Hospital Stay (HOSPITAL_COMMUNITY): Payer: Medicare PPO

## 2021-01-09 ENCOUNTER — Other Ambulatory Visit (HOSPITAL_COMMUNITY): Payer: Self-pay

## 2021-01-09 DIAGNOSIS — I214 Non-ST elevation (NSTEMI) myocardial infarction: Secondary | ICD-10-CM | POA: Diagnosis not present

## 2021-01-09 DIAGNOSIS — I200 Unstable angina: Secondary | ICD-10-CM

## 2021-01-09 DIAGNOSIS — R079 Chest pain, unspecified: Secondary | ICD-10-CM | POA: Diagnosis not present

## 2021-01-09 DIAGNOSIS — I351 Nonrheumatic aortic (valve) insufficiency: Secondary | ICD-10-CM

## 2021-01-09 DIAGNOSIS — I2510 Atherosclerotic heart disease of native coronary artery without angina pectoris: Secondary | ICD-10-CM | POA: Diagnosis not present

## 2021-01-09 HISTORY — PX: CORONARY IMAGING/OCT: CATH118326

## 2021-01-09 HISTORY — PX: LEFT HEART CATH AND CORONARY ANGIOGRAPHY: CATH118249

## 2021-01-09 HISTORY — PX: CORONARY STENT INTERVENTION: CATH118234

## 2021-01-09 LAB — ECHOCARDIOGRAM COMPLETE
AR max vel: 1.6 cm2
AV Area VTI: 1.36 cm2
AV Area mean vel: 1.43 cm2
AV Mean grad: 3 mmHg
AV Peak grad: 5.2 mmHg
Ao pk vel: 1.14 m/s
Area-P 1/2: 3.5 cm2
Height: 65 in
S' Lateral: 2.9 cm
Weight: 2443.2 oz

## 2021-01-09 LAB — POCT ACTIVATED CLOTTING TIME: Activated Clotting Time: 374 seconds

## 2021-01-09 LAB — LIPID PANEL
Cholesterol: 170 mg/dL (ref 0–200)
HDL: 45 mg/dL (ref >40)
LDL Cholesterol: 91 mg/dL (ref 0–99)
Total CHOL/HDL Ratio: 3.8 RATIO
Triglycerides: 171 mg/dL — ABNORMAL HIGH (ref <150)
VLDL: 34 mg/dL (ref 0–40)

## 2021-01-09 LAB — HEPARIN LEVEL (UNFRACTIONATED): Heparin Unfractionated: 0.38 IU/mL (ref 0.30–0.70)

## 2021-01-09 LAB — HIV ANTIBODY (ROUTINE TESTING W REFLEX): HIV Screen 4th Generation wRfx: NONREACTIVE

## 2021-01-09 LAB — BASIC METABOLIC PANEL
Anion gap: 8 (ref 5–15)
BUN: 15 mg/dL (ref 8–23)
CO2: 27 mmol/L (ref 22–32)
Calcium: 9.4 mg/dL (ref 8.9–10.3)
Chloride: 104 mmol/L (ref 98–111)
Creatinine, Ser: 0.87 mg/dL (ref 0.44–1.00)
GFR, Estimated: >60 mL/min (ref >60)
Glucose, Bld: 107 mg/dL — ABNORMAL HIGH (ref 70–99)
Potassium: 3.9 mmol/L (ref 3.5–5.1)
Sodium: 139 mmol/L (ref 135–145)

## 2021-01-09 LAB — CBC
HCT: 42.5 % (ref 36.0–46.0)
Hemoglobin: 14.1 g/dL (ref 12.0–15.0)
MCH: 30.5 pg (ref 26.0–34.0)
MCHC: 33.2 g/dL (ref 30.0–36.0)
MCV: 92 fL (ref 80.0–100.0)
Platelets: 176 10*3/uL (ref 150–400)
RBC: 4.62 MIL/uL (ref 3.87–5.11)
RDW: 12.5 % (ref 11.5–15.5)
WBC: 6.8 10*3/uL (ref 4.0–10.5)
nRBC: 0 % (ref 0.0–0.2)

## 2021-01-09 SURGERY — LEFT HEART CATH AND CORONARY ANGIOGRAPHY
Anesthesia: LOCAL

## 2021-01-09 MED ORDER — HEPARIN (PORCINE) IN NACL 1000-0.9 UT/500ML-% IV SOLN
INTRAVENOUS | Status: AC
  Filled 2021-01-09: qty 500

## 2021-01-09 MED ORDER — SODIUM CHLORIDE 0.9% FLUSH
3.0000 mL | Freq: Two times a day (BID) | INTRAVENOUS | Status: DC
  Administered 2021-01-09 – 2021-01-10 (×2): 3 mL via INTRAVENOUS

## 2021-01-09 MED ORDER — LIDOCAINE HCL (PF) 1 % IJ SOLN
INTRAMUSCULAR | Status: DC | PRN
  Administered 2021-01-09: 2 mL

## 2021-01-09 MED ORDER — FENTANYL CITRATE (PF) 100 MCG/2ML IJ SOLN
INTRAMUSCULAR | Status: DC | PRN
  Administered 2021-01-09: 50 ug via INTRAVENOUS
  Administered 2021-01-09 (×2): 25 ug via INTRAVENOUS

## 2021-01-09 MED ORDER — MIDAZOLAM HCL 2 MG/2ML IJ SOLN
INTRAMUSCULAR | Status: DC | PRN
  Administered 2021-01-09: 1 mg via INTRAVENOUS
  Administered 2021-01-09: 2 mg via INTRAVENOUS

## 2021-01-09 MED ORDER — VERAPAMIL HCL 2.5 MG/ML IV SOLN
INTRAVENOUS | Status: DC | PRN
  Administered 2021-01-09: 10 mL via INTRA_ARTERIAL

## 2021-01-09 MED ORDER — SODIUM CHLORIDE 0.9% FLUSH
3.0000 mL | Freq: Two times a day (BID) | INTRAVENOUS | Status: DC
  Administered 2021-01-09 (×2): 3 mL via INTRAVENOUS

## 2021-01-09 MED ORDER — ACETAMINOPHEN 325 MG PO TABS
650.0000 mg | ORAL_TABLET | ORAL | Status: DC | PRN
  Administered 2021-01-09: 650 mg via ORAL
  Filled 2021-01-09: qty 2

## 2021-01-09 MED ORDER — HEPARIN SODIUM (PORCINE) 1000 UNIT/ML IJ SOLN
INTRAMUSCULAR | Status: DC | PRN
  Administered 2021-01-09: 3500 [IU] via INTRAVENOUS
  Administered 2021-01-09: 5000 [IU] via INTRAVENOUS

## 2021-01-09 MED ORDER — HEPARIN (PORCINE) IN NACL 1000-0.9 UT/500ML-% IV SOLN
INTRAVENOUS | Status: DC | PRN
  Administered 2021-01-09 (×2): 500 mL

## 2021-01-09 MED ORDER — THE SENSUOUS HEART BOOK
Freq: Once | Status: AC
  Filled 2021-01-09: qty 1

## 2021-01-09 MED ORDER — ONDANSETRON HCL 4 MG/2ML IJ SOLN: 4.0000 mg | Freq: Four times a day (QID) | INTRAMUSCULAR | Status: DC | PRN

## 2021-01-09 MED ORDER — VERAPAMIL HCL 2.5 MG/ML IV SOLN
INTRAVENOUS | Status: AC
  Filled 2021-01-09: qty 2

## 2021-01-09 MED ORDER — MIDAZOLAM HCL 2 MG/2ML IJ SOLN
INTRAMUSCULAR | Status: AC
  Filled 2021-01-09: qty 2

## 2021-01-09 MED ORDER — LABETALOL HCL 5 MG/ML IV SOLN: 10.0000 mg | INTRAVENOUS | Status: AC | PRN

## 2021-01-09 MED ORDER — ASPIRIN 81 MG PO CHEW: 81.0000 mg | CHEWABLE_TABLET | Freq: Every day | ORAL | Status: DC

## 2021-01-09 MED ORDER — SODIUM CHLORIDE 0.9 % IV SOLN: 250.0000 mL | INTRAVENOUS | Status: DC | PRN

## 2021-01-09 MED ORDER — SODIUM CHLORIDE 0.9 % IV SOLN: INTRAVENOUS | Status: AC

## 2021-01-09 MED ORDER — TICAGRELOR 90 MG PO TABS
ORAL_TABLET | ORAL | Status: DC | PRN
  Administered 2021-01-09: 180 mg via ORAL

## 2021-01-09 MED ORDER — HEART ATTACK BOUNCING BOOK
Freq: Once | Status: AC
  Filled 2021-01-09: qty 1

## 2021-01-09 MED ORDER — NITROGLYCERIN 1 MG/10 ML FOR IR/CATH LAB
INTRA_ARTERIAL | Status: AC
  Filled 2021-01-09: qty 10

## 2021-01-09 MED ORDER — ANGIOPLASTY BOOK
Freq: Once | Status: AC
  Filled 2021-01-09: qty 1

## 2021-01-09 MED ORDER — FENTANYL CITRATE (PF) 100 MCG/2ML IJ SOLN
INTRAMUSCULAR | Status: AC
  Filled 2021-01-09: qty 2

## 2021-01-09 MED ORDER — IOHEXOL 350 MG/ML SOLN
INTRAVENOUS | Status: DC | PRN
  Administered 2021-01-09: 140 mL

## 2021-01-09 MED ORDER — HYDRALAZINE HCL 20 MG/ML IJ SOLN: 10.0000 mg | INTRAMUSCULAR | Status: AC | PRN

## 2021-01-09 MED ORDER — NITROGLYCERIN 1 MG/10 ML FOR IR/CATH LAB
INTRA_ARTERIAL | Status: DC | PRN
  Administered 2021-01-09: 300 ug via INTRA_ARTERIAL
  Administered 2021-01-09 (×2): 200 ug via INTRACORONARY

## 2021-01-09 MED ORDER — TICAGRELOR 90 MG PO TABS
ORAL_TABLET | ORAL | Status: AC
  Filled 2021-01-09: qty 2

## 2021-01-09 MED ORDER — SODIUM CHLORIDE 0.9 % WEIGHT BASED INFUSION: 1.0000 mL/kg/h | INTRAVENOUS | Status: DC

## 2021-01-09 MED ORDER — TICAGRELOR 90 MG PO TABS
ORAL_TABLET | ORAL | Status: AC
  Filled 2021-01-09: qty 1

## 2021-01-09 MED ORDER — TICAGRELOR 90 MG PO TABS
90.0000 mg | ORAL_TABLET | Freq: Two times a day (BID) | ORAL | Status: DC
  Administered 2021-01-09 – 2021-01-10 (×2): 90 mg via ORAL
  Filled 2021-01-09 (×2): qty 1

## 2021-01-09 MED ORDER — SODIUM CHLORIDE 0.9 % IV SOLN: INTRAVENOUS | Status: DC

## 2021-01-09 MED ORDER — SODIUM CHLORIDE 0.9 % WEIGHT BASED INFUSION: 3.0000 mL/kg/h | INTRAVENOUS | Status: DC

## 2021-01-09 MED ORDER — PERFLUTREN LIPID MICROSPHERE
1.0000 mL | INTRAVENOUS | Status: AC | PRN
  Administered 2021-01-09: 4 mL via INTRAVENOUS
  Filled 2021-01-09: qty 10

## 2021-01-09 MED ORDER — HEPARIN SODIUM (PORCINE) 1000 UNIT/ML IJ SOLN
INTRAMUSCULAR | Status: AC
  Filled 2021-01-09: qty 1

## 2021-01-09 MED ORDER — SODIUM CHLORIDE 0.9% FLUSH: 3.0000 mL | INTRAVENOUS | Status: DC | PRN

## 2021-01-09 MED ORDER — LIDOCAINE HCL (PF) 1 % IJ SOLN
INTRAMUSCULAR | Status: AC
  Filled 2021-01-09: qty 30

## 2021-01-09 SURGICAL SUPPLY — 21 items
BALLN SAPPHIRE 2.5X20 (BALLOONS) ×2
BALLN SAPPHIRE ~~LOC~~ 3.5X15 (BALLOONS) ×1 IMPLANT
BALLOON SAPPHIRE 2.5X20 (BALLOONS) IMPLANT
CATH 5FR JL3.5 JR4 ANG PIG MP (CATHETERS) ×1 IMPLANT
CATH DRAGONFLY OPSTAR (CATHETERS) ×1 IMPLANT
CATH LAUNCHER 6FR EBU 3 (CATHETERS) ×1 IMPLANT
DEVICE RAD TR BAND REGULAR (VASCULAR PRODUCTS) ×1 IMPLANT
GLIDESHEATH SLEND SS 6F .021 (SHEATH) ×1 IMPLANT
GUIDEWIRE INQWIRE 1.5J.035X260 (WIRE) IMPLANT
INQWIRE 1.5J .035X260CM (WIRE) ×2
KIT ENCORE 26 ADVANTAGE (KITS) ×1 IMPLANT
KIT HEART LEFT (KITS) ×2 IMPLANT
PACK CARDIAC CATHETERIZATION (CUSTOM PROCEDURE TRAY) ×2 IMPLANT
SHEATH PROBE COVER 6X72 (BAG) ×1 IMPLANT
STENT SYNERGY XD 3.0X28 (Permanent Stent) IMPLANT
SYNERGY XD 3.0X28 (Permanent Stent) ×2 IMPLANT
TRANSDUCER W/STOPCOCK (MISCELLANEOUS) ×2 IMPLANT
TUBING CIL FLEX 10 FLL-RA (TUBING) ×2 IMPLANT
VALVE COPILOT STAT (MISCELLANEOUS) ×1 IMPLANT
WIRE ASAHI PROWATER 180CM (WIRE) ×1 IMPLANT
WIRE HI TORQ BMW 190CM (WIRE) ×1 IMPLANT

## 2021-01-09 NOTE — Plan of Care (Signed)
  Problem: Activity: Goal: Risk for activity intolerance will decrease Outcome: Progressing   Problem: Nutrition: Goal: Adequate nutrition will be maintained Outcome: Progressing   Problem: Coping: Goal: Level of anxiety will decrease Outcome: Progressing   

## 2021-01-09 NOTE — Progress Notes (Signed)
DAILY PROGRESS NOTE   Patient Name: Debbie Frazier Date of Encounter: 01/09/2021 Cardiologist: Chrystie Nose, MD  Chief Complaint   No further chest pain  Patient Profile   Debbie Frazier is a 65 y.o. female with a history of OSA on CPAP, depression, glaucoma, and HLD presented with chest pain found to have NSTEMI.  Subjective   No chest pain overnight- Troponins were 1265 and 1526. Labs otherwise normal today. LDL 91, trigs 171. Atorvastatin increased from 10 mg to 80 mg for NSTEMI. COVID negative. Plan for LHC today. Says she is committed to alcohol cessation, which is daily.  Objective   Vitals:   01/08/21 1904 01/08/21 2005 01/08/21 2222 01/09/21 0500  BP: 137/76 128/82 124/65 114/77  Pulse: 73 83 71 62  Resp: 18 18  18   Temp: 98.2 F (36.8 C) 98.1 F (36.7 C)  97.8 F (36.6 C)  TempSrc: Oral Oral  Oral  SpO2: 100% 94%  97%  Weight:  69.9 kg  69.3 kg  Height:  5\' 5"  (1.651 m)      Intake/Output Summary (Last 24 hours) at 01/09/2021 0749 Last data filed at 01/09/2021 0636 Gross per 24 hour  Intake 93.67 ml  Output --  Net 93.67 ml   Filed Weights   01/08/21 1422 01/08/21 2005 01/09/21 0500  Weight: 69.9 kg 69.9 kg 69.3 kg    Physical Exam   General appearance: alert and no distress Neck: no carotid bruit, no JVD and thyroid not enlarged, symmetric, no tenderness/mass/nodules Lungs: clear to auscultation bilaterally Heart: regular rate and rhythm, S1, S2 normal, no murmur, click, rub or gallop Abdomen: soft, non-tender; bowel sounds normal; no masses,  no organomegaly Extremities: extremities normal, atraumatic, no cyanosis or edema Pulses: 2+ and symmetric Skin: Skin color, texture, turgor normal. No rashes or lesions Neurologic: Grossly normal Psych: Pleasant  Inpatient Medications    Scheduled Meds: . aspirin EC  81 mg Oral Daily  . atorvastatin  80 mg Oral Daily  . levothyroxine  150 mcg Oral Q0600  . metoprolol tartrate  12.5 mg Oral  BID  . sertraline  100 mg Oral QHS  . sodium chloride flush  3 mL Intravenous Q12H  . timolol  1 drop Left Eye BID    Continuous Infusions: . heparin 800 Units/hr (01/08/21 1709)    PRN Meds: acetaminophen, hydrOXYzine, nitroGLYCERIN, ondansetron (ZOFRAN) IV   Labs   Results for orders placed or performed during the hospital encounter of 01/08/21 (from the past 48 hour(s))  Basic metabolic panel     Status: None   Collection Time: 01/08/21  2:42 PM  Result Value Ref Range   Sodium 139 135 - 145 mmol/L   Potassium 4.3 3.5 - 5.1 mmol/L   Chloride 106 98 - 111 mmol/L   CO2 24 22 - 32 mmol/L   Glucose, Bld 91 70 - 99 mg/dL    Comment: Glucose reference range applies only to samples taken after fasting for at least 8 hours.   BUN 11 8 - 23 mg/dL   Creatinine, Ser 01/10/21 0.44 - 1.00 mg/dL   Calcium 9.1 8.9 - 01/10/21 mg/dL   GFR, Estimated 0.62 69.4 mL/min    Comment: (NOTE) Calculated using the CKD-EPI Creatinine Equation (2021)    Anion gap 9 5 - 15    Comment: Performed at [facility], [address], [place], [address] [place]  CBC     Status: None   Collection Time: 01/08/21  2:42 PM  Result Value Ref Range   WBC 6.6 4.0 - 10.5 K/uL   RBC 4.69 3.87 - 5.11 MIL/uL   Hemoglobin 14.2 12.0 - 15.0 g/dL   HCT 47.0 96.2 - 83.6 %   MCV 90.4 80.0 - 100.0 fL   MCH 30.3 26.0 - 34.0 pg   MCHC 33.5 30.0 - 36.0 g/dL   RDW 62.9 47.6 - 54.6 %   Platelets 177 150 - 400 K/uL   nRBC 0.0 0.0 - 0.2 %    Comment: Performed at [facility], [address], [place], [place] [address]  Troponin I (High Sensitivity)     Status: Abnormal   Collection Time: 01/08/21  2:42 PM  Result Value Ref Range   Troponin I (High Sensitivity) 1,265 (HH) <18 ng/L    Comment: CRITICAL RESULT CALLED TO, READ BACK BY AND VERIFIED WITH: Deatra Canter  1544 01/08/2021 WBOND (NOTE) Elevated high sensitivity troponin I (hsTnI) values and significant  changes across serial measurements may suggest ACS but  many other  chronic and acute conditions are known to elevate hsTnI results.  Refer to the Links section for chest pain algorithms and additional  guidance. Performed at [facility], [address], [place], [place] [address]   Resp Panel by RT-PCR (Flu A&B, Covid) Nasopharyngeal Swab     Status: None   Collection Time: 01/08/21  5:06 PM   Specimen: Nasopharyngeal Swab; Nasopharyngeal(NP) swabs in vial transport medium  Result Value Ref Range   SARS Coronavirus 2 by RT PCR NEGATIVE NEGATIVE    Comment: (NOTE) SARS-CoV-2 target nucleic acids are NOT DETECTED.  The SARS-CoV-2 RNA is generally detectable in upper respiratory specimens during the acute phase of infection. The lowest concentration of SARS-CoV-2 viral copies this assay can detect is 138 copies/mL. A negative result does not preclude SARS-Cov-2 infection and should not be used as the sole basis for treatment or other patient management decisions. A negative result may occur with  improper specimen collection/handling, submission of specimen other than nasopharyngeal swab, presence of viral mutation(s) within the areas targeted by this assay, and inadequate number of viral copies(<138 copies/mL). A negative result must be combined with clinical observations, patient history, and epidemiological information. The expected result is Negative.  Fact Sheet for Patients:  BloggerCourse.com  Fact Sheet for Healthcare Providers:  SeriousBroker.it  This test is no t yet approved or cleared by the [place] FDA and  has been authorized for detection and/or diagnosis of SARS-CoV-2 by FDA under an Emergency Use Authorization (EUA). This EUA will remain  in effect (meaning this test can be used) for the duration of the COVID-19 declaration under Section 564(b)(1) of the Act, 21 U.S.C.section 360bbb-3(b)(1), unless the authorization is terminated  or revoked sooner.        Influenza A by PCR NEGATIVE NEGATIVE   Influenza B by PCR NEGATIVE NEGATIVE    Comment: (NOTE) The Xpert Xpress SARS-CoV-2/FLU/RSV plus assay is intended as an aid in the diagnosis of influenza from Nasopharyngeal swab specimens and should not be used as a sole basis for treatment. Nasal washings and aspirates are unacceptable for Xpert Xpress SARS-CoV-2/FLU/RSV testing.  Fact Sheet for Patients: BloggerCourse.com  Fact Sheet for Healthcare Providers: SeriousBroker.it  This test is not yet approved or cleared by the [place] FDA and has been authorized for detection and/or diagnosis of SARS-CoV-2 by FDA under an Emergency Use Authorization (EUA). This EUA will remain in effect (meaning this test can  be used) for the duration of the COVID-19 declaration under Section 564(b)(1) of the Act, 21 U.S.C. section 360bbb-3(b)(1), unless the authorization is terminated or revoked.  Performed at [facility], [address], [place], [place] [address]   Troponin I (High Sensitivity)     Status: Abnormal   Collection Time: 01/08/21  6:17 PM  Result Value Ref Range   Troponin I (High Sensitivity) 1,526 (HH) <18 ng/L    Comment: CRITICAL VALUE NOTED.  VALUE IS CONSISTENT WITH PREVIOUSLY REPORTED AND CALLED VALUE. (NOTE) Elevated high sensitivity troponin I (hsTnI) values and significant  changes across serial measurements may suggest ACS but many other  chronic and acute conditions are known to elevate hsTnI results.  Refer to the Links section for chest pain algorithms and additional  guidance. Performed at [facility], [address], [place], [place] [address]   Heparin level (unfractionated)     Status: None   Collection Time: 01/08/21 10:40 PM  Result Value Ref Range   Heparin Unfractionated 0.44 0.30 - 0.70 IU/mL    Comment: (NOTE) The clinical reportable range upper limit is being lowered to >1.10 to align  with the FDA approved guidance for the current laboratory assay.  If heparin results are below expected values, and patient dosage has  been confirmed, suggest follow up testing of antithrombin III levels. Performed at [facility], [address] [place], [place] [address]   HIV Antibody (routine testing w rflx)     Status: None   Collection Time: 01/08/21 10:40 PM  Result Value Ref Range   HIV Screen 4th Generation wRfx Non Reactive Non Reactive    Comment: Performed at [facility], [address], [place], [place] [address]  Basic metabolic panel     Status: Abnormal   Collection Time: 01/09/21  2:28 AM  Result Value Ref Range   Sodium 139 135 - 145 mmol/L   Potassium 3.9 3.5 - 5.1 mmol/L   Chloride 104 98 - 111 mmol/L   CO2 27 22 - 32 mmol/L   Glucose, Bld 107 (H) 70 - 99 mg/dL    Comment: Glucose reference range applies only to samples taken after fasting for at least 8 hours.   BUN 15 8 - 23 mg/dL   Creatinine, Ser 3.76 0.44 - 1.00 mg/dL   Calcium 9.4 8.9 - 28.3 mg/dL   GFR, Estimated >15 >17 mL/min    Comment: (NOTE) Calculated using the CKD-EPI Creatinine Equation (2021)    Anion gap 8 5 - 15    Comment: Performed at [facility], [address] [place], [place] [address]  Lipid panel     Status: Abnormal   Collection Time: 01/09/21  2:28 AM  Result Value Ref Range   Cholesterol 170 0 - 200 mg/dL   Triglycerides 371 (H) <150 mg/dL   HDL 45 >06 mg/dL   Total CHOL/HDL Ratio 3.8 RATIO   VLDL 34 0 - 40 mg/dL   LDL Cholesterol 91 0 - 99 mg/dL    Comment:        Total Cholesterol/HDL:CHD Risk Coronary Heart Disease Risk Table                     Men   Women  1/2 Average Risk   3.4   3.3  Average Risk       5.0   4.4  2 X Average Risk   9.6   7.1  3 X Average Risk  23.4   11.0        Use the calculated Patient Ratio above and the CHD Risk Table to determine the patient's CHD Risk.        ATP III CLASSIFICATION (LDL):  <100     mg/dL    Optimal  100-129  mg/dL   Near or Above                    Optimal  130-159  mg/dL   Borderline  160-189  mg/dL   High  >190     mg/dL   Very High Performed at [facility], [address] [place], [place] [address]   CBC     Status: None   Collection Time: 01/09/21  2:28 AM  Result Value Ref Range   WBC 6.8 4.0 - 10.5 K/uL   RBC 4.62 3.87 - 5.11 MIL/uL   Hemoglobin 14.1 12.0 - 15.0 g/dL   HCT 42.5 36.0 - 46.0 %   MCV 92.0 80.0 - 100.0 fL   MCH 30.5 26.0 - 34.0 pg   MCHC 33.2 30.0 - 36.0 g/dL   RDW 12.5 11.5 - 15.5 %   Platelets 176 150 - 400 K/uL   nRBC 0.0 0.0 - 0.2 %    Comment: Performed at [facility], [address] [place], [place] [address]  Heparin level (unfractionated)     Status: None   Collection Time: 01/09/21  2:28 AM  Result Value Ref Range   Heparin Unfractionated 0.38 0.30 - 0.70 IU/mL    Comment: (NOTE) The clinical reportable range upper limit is being lowered to >1.10 to align with the FDA approved guidance for the current laboratory assay.  If heparin results are below expected values, and patient dosage has  been confirmed, suggest follow up testing of antithrombin III levels. Performed at [facility], [address] [place], [place] [address]     ECG   N/A  Telemetry   Sinus rhythm - Personally Reviewed  Radiology    DG Chest 2 View  Result Date: 01/08/2021 CLINICAL DATA:  Chest pain. EXAM: CHEST - 2 VIEW COMPARISON:  No prior. FINDINGS: Mediastinum hilar structures normal. Heart size normal. No focal infiltrate. No pleural effusion or pneumothorax. Degenerative change thoracic spine. IMPRESSION: No acute cardiopulmonary disease. Electronically Signed   By: Thomas  Register   On: 01/08/2021 15:29    Cardiac Studies   Echo pending  Assessment   1. Active Problems: 2.   Unstable angina (HCC) 3.   Plan   1. No further chest pain. Plan for LHC today. Discussed alcohol cessation. Echo pending later today. No  further questions about cath.  Time Spent Directly with Patient:  I have spent a total of 25 minutes with the patient reviewing hospital notes, telemetry, EKGs, labs and examining the patient as well as establishing an assessment and plan that was discussed personally with the patient.  > 50% of time was spent in direct patient care.  Length of Stay:  LOS: 1 day   Tynell Winchell C. Matasha Smigelski, MD, FACC, FACP  [place]  [facility]  Medical Director of the [facility]  [facility] Diplomate of the American Board of Clinical Lipidology Attending Cardiologist  Direct Dial: [phone]  Fax: [phone]  Website:  [email or web address]  Apollo Timothy C Nikea Settle 01/09/2021, 7:49 AM   

## 2021-01-09 NOTE — Progress Notes (Signed)
ANTICOAGULATION CONSULT NOTE  Pharmacy Consult for heparin dosing.  Indication: chest pain/ACS  Allergies  Allergen Reactions  . Brimonidine Other (See Comments)    Pt was not able to tolerate     Patient Measurements: Height: 5\' 5"  (165.1 cm) Weight: 69.3 kg (152 lb 11.2 oz) IBW/kg (Calculated) : 57 Heparin Dosing Weight: 69.9 kg  Vital Signs: Temp: 97.8 F (36.6 C) (04/29 0500) Temp Source: Oral (04/29 0500) BP: 114/77 (04/29 0500) Pulse Rate: 62 (04/29 0500)  Labs: Recent Labs    01/08/21 1442 01/08/21 1817 01/08/21 2240 01/09/21 0228  HGB 14.2  --   --  14.1  HCT 42.4  --   --  42.5  PLT 177  --   --  176  HEPARINUNFRC  --   --  0.44 0.38  CREATININE 0.64  --   --  0.87  TROPONINIHS 1,265* 1,526*  --   --     Estimated Creatinine Clearance: 62.2 mL/min (by C-G formula based on SCr of 0.87 mg/dL).    Assessment: 65 y.o. female presenting with chest pain. Not on AC PTA. Initial CBC wnl, Scr <1, Troponin elevated at 1265. Pharmacy has been consulted to dose IV heparin.   Heparin level is therapeutic (0.38) on gtt at 800 units/hr. No s/sx of bleeding or infusion issues. Plan for L heart cath in a.m.  Goal of Therapy:  Heparin level 0.3-0.7 units/ml Monitor platelets by anticoagulation protocol: Yes   Plan:  Continue heparin at 800 units/hr Monitor daily HL, CBC F/u after cath   69, PharmD, BCCCP Clinical Pharmacist  Phone: [phone] 01/09/2021 7:24 AM  Please check AMION for all [facility] Pharmacy phone numbers After 10:00 PM, call Main Pharmacy [phone]

## 2021-01-09 NOTE — Interval H&P Note (Signed)
History and Physical Interval Note:  01/09/2021 9:58 AMCath Lab Visit (complete for each Cath Lab visit)  Clinical Evaluation Leading to the Procedure:   ACS: Yes.    Non-ACS:    Anginal Classification: CCS IV  Anti-ischemic medical therapy: Minimal Therapy (1 class of medications)  Non-Invasive Test Results: No non-invasive testing performed  Prior CABG: No previous CABG        Debbie Frazier  has presented today for surgery, with the diagnosis of nstemi.  The various methods of treatment have been discussed with the patient and family. After consideration of risks, benefits and other options for treatment, the patient has consented to  Procedure(s): LEFT HEART CATH AND CORONARY ANGIOGRAPHY (N/A) as a surgical intervention.  The patient's history has been reviewed, patient examined, no change in status, stable for surgery.  I have reviewed the patient's chart and labs.  Questions were answered to the patient's satisfaction.     Lance Muss

## 2021-01-09 NOTE — TOC Benefit Eligibility Note (Signed)
Patient Advocate Encounter  Insurance verification completed.    The patient is currently admitted and upon discharge could be taking Brilinta 90 mg.  The current 30 day co-pay is, $40.00.   The patient is insured through [employer] Part D     Mekaila Tarnow, CPhT Pharmacy Patient Advocate Specialist [place] [facility] Stewardship Team Direct Number: [phone]  Fax: [phone]        

## 2021-01-09 NOTE — H&P (View-Only) (Signed)
DAILY PROGRESS NOTE   Patient Name: Debbie Frazier Date of Encounter: 01/09/2021 Cardiologist: Chrystie Nose, MD  Chief Complaint   No further chest pain  Patient Profile   Debbie Frazier is a 65 y.o. female with a history of OSA on CPAP, depression, glaucoma, and HLD presented with chest pain found to have NSTEMI.  Subjective   No chest pain overnight- Troponins were 1265 and 1526. Labs otherwise normal today. LDL 91, trigs 171. Atorvastatin increased from 10 mg to 80 mg for NSTEMI. COVID negative. Plan for LHC today. Says she is committed to alcohol cessation, which is daily.  Objective   Vitals:   01/08/21 1904 01/08/21 2005 01/08/21 2222 01/09/21 0500  BP: 137/76 128/82 124/65 114/77  Pulse: 73 83 71 62  Resp: 18 18  18   Temp: 98.2 F (36.8 C) 98.1 F (36.7 C)  97.8 F (36.6 C)  TempSrc: Oral Oral  Oral  SpO2: 100% 94%  97%  Weight:  69.9 kg  69.3 kg  Height:  5\' 5"  (1.651 m)      Intake/Output Summary (Last 24 hours) at 01/09/2021 0749 Last data filed at 01/09/2021 0636 Gross per 24 hour  Intake 93.67 ml  Output --  Net 93.67 ml   Filed Weights   01/08/21 1422 01/08/21 2005 01/09/21 0500  Weight: 69.9 kg 69.9 kg 69.3 kg    Physical Exam   General appearance: alert and no distress Neck: no carotid bruit, no JVD and thyroid not enlarged, symmetric, no tenderness/mass/nodules Lungs: clear to auscultation bilaterally Heart: regular rate and rhythm, S1, S2 normal, no murmur, click, rub or gallop Abdomen: soft, non-tender; bowel sounds normal; no masses,  no organomegaly Extremities: extremities normal, atraumatic, no cyanosis or edema Pulses: 2+ and symmetric Skin: Skin color, texture, turgor normal. No rashes or lesions Neurologic: Grossly normal Psych: Pleasant  Inpatient Medications    Scheduled Meds: . aspirin EC  81 mg Oral Daily  . atorvastatin  80 mg Oral Daily  . levothyroxine  150 mcg Oral Q0600  . metoprolol tartrate  12.5 mg Oral  BID  . sertraline  100 mg Oral QHS  . sodium chloride flush  3 mL Intravenous Q12H  . timolol  1 drop Left Eye BID    Continuous Infusions: . heparin 800 Units/hr (01/08/21 1709)    PRN Meds: acetaminophen, hydrOXYzine, nitroGLYCERIN, ondansetron (ZOFRAN) IV   Labs   Results for orders placed or performed during the hospital encounter of 01/08/21 (from the past 48 hour(s))  Basic metabolic panel     Status: None   Collection Time: 01/08/21  2:42 PM  Result Value Ref Range   Sodium 139 135 - 145 mmol/L   Potassium 4.3 3.5 - 5.1 mmol/L   Chloride 106 98 - 111 mmol/L   CO2 24 22 - 32 mmol/L   Glucose, Bld 91 70 - 99 mg/dL    Comment: Glucose reference range applies only to samples taken after fasting for at least 8 hours.   BUN 11 8 - 23 mg/dL   Creatinine, Ser 01/10/21 0.44 - 1.00 mg/dL   Calcium 9.1 8.9 - 01/10/21 mg/dL   GFR, Estimated 0.62 69.4 mL/min    Comment: (NOTE) Calculated using the CKD-EPI Creatinine Equation (2021)    Anion gap 9 5 - 15    Comment: Performed at [facility], [address], [place], [address] [place]  CBC     Status: None   Collection Time: 01/08/21  2:42 PM  Result Value Ref Range   WBC 6.6 4.0 - 10.5 K/uL   RBC 4.69 3.87 - 5.11 MIL/uL   Hemoglobin 14.2 12.0 - 15.0 g/dL   HCT 47.0 96.2 - 83.6 %   MCV 90.4 80.0 - 100.0 fL   MCH 30.3 26.0 - 34.0 pg   MCHC 33.5 30.0 - 36.0 g/dL   RDW 62.9 47.6 - 54.6 %   Platelets 177 150 - 400 K/uL   nRBC 0.0 0.0 - 0.2 %    Comment: Performed at [facility], [address], [place], [place] [address]  Troponin I (High Sensitivity)     Status: Abnormal   Collection Time: 01/08/21  2:42 PM  Result Value Ref Range   Troponin I (High Sensitivity) 1,265 (HH) <18 ng/L    Comment: CRITICAL RESULT CALLED TO, READ BACK BY AND VERIFIED WITH: Deatra Canter  1544 01/08/2021 WBOND (NOTE) Elevated high sensitivity troponin I (hsTnI) values and significant  changes across serial measurements may suggest ACS but  many other  chronic and acute conditions are known to elevate hsTnI results.  Refer to the Links section for chest pain algorithms and additional  guidance. Performed at [facility], [address], [place], [place] [address]   Resp Panel by RT-PCR (Flu A&B, Covid) Nasopharyngeal Swab     Status: None   Collection Time: 01/08/21  5:06 PM   Specimen: Nasopharyngeal Swab; Nasopharyngeal(NP) swabs in vial transport medium  Result Value Ref Range   SARS Coronavirus 2 by RT PCR NEGATIVE NEGATIVE    Comment: (NOTE) SARS-CoV-2 target nucleic acids are NOT DETECTED.  The SARS-CoV-2 RNA is generally detectable in upper respiratory specimens during the acute phase of infection. The lowest concentration of SARS-CoV-2 viral copies this assay can detect is 138 copies/mL. A negative result does not preclude SARS-Cov-2 infection and should not be used as the sole basis for treatment or other patient management decisions. A negative result may occur with  improper specimen collection/handling, submission of specimen other than nasopharyngeal swab, presence of viral mutation(s) within the areas targeted by this assay, and inadequate number of viral copies(<138 copies/mL). A negative result must be combined with clinical observations, patient history, and epidemiological information. The expected result is Negative.  Fact Sheet for Patients:  BloggerCourse.com  Fact Sheet for Healthcare Providers:  SeriousBroker.it  This test is no t yet approved or cleared by the [place] FDA and  has been authorized for detection and/or diagnosis of SARS-CoV-2 by FDA under an Emergency Use Authorization (EUA). This EUA will remain  in effect (meaning this test can be used) for the duration of the COVID-19 declaration under Section 564(b)(1) of the Act, 21 U.S.C.section 360bbb-3(b)(1), unless the authorization is terminated  or revoked sooner.        Influenza A by PCR NEGATIVE NEGATIVE   Influenza B by PCR NEGATIVE NEGATIVE    Comment: (NOTE) The Xpert Xpress SARS-CoV-2/FLU/RSV plus assay is intended as an aid in the diagnosis of influenza from Nasopharyngeal swab specimens and should not be used as a sole basis for treatment. Nasal washings and aspirates are unacceptable for Xpert Xpress SARS-CoV-2/FLU/RSV testing.  Fact Sheet for Patients: BloggerCourse.com  Fact Sheet for Healthcare Providers: SeriousBroker.it  This test is not yet approved or cleared by the [place] FDA and has been authorized for detection and/or diagnosis of SARS-CoV-2 by FDA under an Emergency Use Authorization (EUA). This EUA will remain in effect (meaning this test can  be used) for the duration of the COVID-19 declaration under Section 564(b)(1) of the Act, 21 U.S.C. section 360bbb-3(b)(1), unless the authorization is terminated or revoked.  Performed at [facility], [address], [place], [place] [address]   Troponin I (High Sensitivity)     Status: Abnormal   Collection Time: 01/08/21  6:17 PM  Result Value Ref Range   Troponin I (High Sensitivity) 1,526 (HH) <18 ng/L    Comment: CRITICAL VALUE NOTED.  VALUE IS CONSISTENT WITH PREVIOUSLY REPORTED AND CALLED VALUE. (NOTE) Elevated high sensitivity troponin I (hsTnI) values and significant  changes across serial measurements may suggest ACS but many other  chronic and acute conditions are known to elevate hsTnI results.  Refer to the Links section for chest pain algorithms and additional  guidance. Performed at [facility], [address], [place], [place] [address]   Heparin level (unfractionated)     Status: None   Collection Time: 01/08/21 10:40 PM  Result Value Ref Range   Heparin Unfractionated 0.44 0.30 - 0.70 IU/mL    Comment: (NOTE) The clinical reportable range upper limit is being lowered to >1.10 to align  with the FDA approved guidance for the current laboratory assay.  If heparin results are below expected values, and patient dosage has  been confirmed, suggest follow up testing of antithrombin III levels. Performed at [facility], [address] [place], [place] [address]   HIV Antibody (routine testing w rflx)     Status: None   Collection Time: 01/08/21 10:40 PM  Result Value Ref Range   HIV Screen 4th Generation wRfx Non Reactive Non Reactive    Comment: Performed at [facility], [address], [place], [place] [address]  Basic metabolic panel     Status: Abnormal   Collection Time: 01/09/21  2:28 AM  Result Value Ref Range   Sodium 139 135 - 145 mmol/L   Potassium 3.9 3.5 - 5.1 mmol/L   Chloride 104 98 - 111 mmol/L   CO2 27 22 - 32 mmol/L   Glucose, Bld 107 (H) 70 - 99 mg/dL    Comment: Glucose reference range applies only to samples taken after fasting for at least 8 hours.   BUN 15 8 - 23 mg/dL   Creatinine, Ser 3.76 0.44 - 1.00 mg/dL   Calcium 9.4 8.9 - 28.3 mg/dL   GFR, Estimated >15 >17 mL/min    Comment: (NOTE) Calculated using the CKD-EPI Creatinine Equation (2021)    Anion gap 8 5 - 15    Comment: Performed at [facility], [address] [place], [place] [address]  Lipid panel     Status: Abnormal   Collection Time: 01/09/21  2:28 AM  Result Value Ref Range   Cholesterol 170 0 - 200 mg/dL   Triglycerides 371 (H) <150 mg/dL   HDL 45 >06 mg/dL   Total CHOL/HDL Ratio 3.8 RATIO   VLDL 34 0 - 40 mg/dL   LDL Cholesterol 91 0 - 99 mg/dL    Comment:        Total Cholesterol/HDL:CHD Risk Coronary Heart Disease Risk Table                     Men   Women  1/2 Average Risk   3.4   3.3  Average Risk       5.0   4.4  2 X Average Risk   9.6   7.1  3 X Average Risk  23.4   11.0        Use the calculated Patient Ratio above and the CHD Risk Table to determine the patient's CHD Risk.        ATP III CLASSIFICATION (LDL):  <100     mg/dL    Optimal  [id number]  mg/dL   Near or Above                    Optimal  130-159  mg/dL   Borderline  [phone]  mg/dL   High  >284>190     mg/dL   Very High Performed at [facility], [address] [place], [place] [address]   CBC     Status: None   Collection Time: 01/09/21  2:28 AM  Result Value Ref Range   WBC 6.8 4.0 - 10.5 K/uL   RBC 4.62 3.87 - 5.11 MIL/uL   Hemoglobin 14.1 12.0 - 15.0 g/dL   HCT 01.042.5 27.236.0 - 53.646.0 %   MCV 92.0 80.0 - 100.0 fL   MCH 30.5 26.0 - 34.0 pg   MCHC 33.2 30.0 - 36.0 g/dL   RDW 64.412.5 03.411.5 - 74.215.5 %   Platelets 176 150 - 400 K/uL   nRBC 0.0 0.0 - 0.2 %    Comment: Performed at [facility], [address] [place], [place] [address]  Heparin level (unfractionated)     Status: None   Collection Time: 01/09/21  2:28 AM  Result Value Ref Range   Heparin Unfractionated 0.38 0.30 - 0.70 IU/mL    Comment: (NOTE) The clinical reportable range upper limit is being lowered to >1.10 to align with the FDA approved guidance for the current laboratory assay.  If heparin results are below expected values, and patient dosage has  been confirmed, suggest follow up testing of antithrombin III levels. Performed at [facility], [address] [place], [place] [address]     ECG   N/A  Telemetry   Sinus rhythm - Personally Reviewed  Radiology    DG Chest 2 View  Result Date: 01/08/2021 CLINICAL DATA:  Chest pain. EXAM: CHEST - 2 VIEW COMPARISON:  No prior. FINDINGS: Mediastinum hilar structures normal. Heart size normal. No focal infiltrate. No pleural effusion or pneumothorax. Degenerative change thoracic spine. IMPRESSION: No acute cardiopulmonary disease. Electronically Signed   By: Maisie Fushomas  Register   On: 01/08/2021 15:29    Cardiac Studies   Echo pending  Assessment   1. Active Problems: 2.   Unstable angina (HCC) 3.   Plan   1. No further chest pain. Plan for LHC today. Discussed alcohol cessation. Echo pending later today. No  further questions about cath.  Time Spent Directly with Patient:  I have spent a total of 25 minutes with the patient reviewing hospital notes, telemetry, EKGs, labs and examining the patient as well as establishing an assessment and plan that was discussed personally with the patient.  > 50% of time was spent in direct patient care.  Length of Stay:  LOS: 1 day   Chrystie NoseKenneth C. Marquon Alcala, MD, [facility]al Health HospitalFACC, FACP  [place]  [facility]  Medical Director of the [facility] Lipid [facility]  Cardiovascular Risk Reduction Clinic Diplomate of the American Board of Clinical Lipidology Attending Cardiologist  Direct Dial: [phone]  Fax: [phone]  Website:  [email or web address]  Jacalynn Buzzell C Latyra Jaye 01/09/2021, 7:49 AM

## 2021-01-10 ENCOUNTER — Other Ambulatory Visit: Payer: Self-pay | Admitting: Physician Assistant

## 2021-01-10 ENCOUNTER — Encounter (HOSPITAL_COMMUNITY): Payer: Self-pay | Admitting: Internal Medicine

## 2021-01-10 DIAGNOSIS — Z9861 Coronary angioplasty status: Secondary | ICD-10-CM

## 2021-01-10 DIAGNOSIS — I5021 Acute systolic (congestive) heart failure: Secondary | ICD-10-CM | POA: Diagnosis not present

## 2021-01-10 DIAGNOSIS — I255 Ischemic cardiomyopathy: Secondary | ICD-10-CM

## 2021-01-10 DIAGNOSIS — I2510 Atherosclerotic heart disease of native coronary artery without angina pectoris: Secondary | ICD-10-CM

## 2021-01-10 DIAGNOSIS — I214 Non-ST elevation (NSTEMI) myocardial infarction: Secondary | ICD-10-CM | POA: Diagnosis not present

## 2021-01-10 DIAGNOSIS — E785 Hyperlipidemia, unspecified: Secondary | ICD-10-CM

## 2021-01-10 HISTORY — DX: Atherosclerotic heart disease of native coronary artery without angina pectoris: I25.10

## 2021-01-10 LAB — CBC
HCT: 42.7 % (ref 36.0–46.0)
Hemoglobin: 14.4 g/dL (ref 12.0–15.0)
MCH: 30.4 pg (ref 26.0–34.0)
MCHC: 33.7 g/dL (ref 30.0–36.0)
MCV: 90.3 fL (ref 80.0–100.0)
Platelets: 177 K/uL (ref 150–400)
RBC: 4.73 MIL/uL (ref 3.87–5.11)
RDW: 12.4 % (ref 11.5–15.5)
WBC: 7.7 K/uL (ref 4.0–10.5)
nRBC: 0 % (ref 0.0–0.2)

## 2021-01-10 LAB — BASIC METABOLIC PANEL
Anion gap: 10 (ref 5–15)
BUN: 11 mg/dL (ref 8–23)
CO2: 27 mmol/L (ref 22–32)
Calcium: 9.7 mg/dL (ref 8.9–10.3)
Chloride: 102 mmol/L (ref 98–111)
Creatinine, Ser: 0.83 mg/dL (ref 0.44–1.00)
GFR, Estimated: >60 mL/min (ref >60)
Glucose, Bld: 110 mg/dL — ABNORMAL HIGH (ref 70–99)
Potassium: 4.3 mmol/L (ref 3.5–5.1)
Sodium: 139 mmol/L (ref 135–145)

## 2021-01-10 MED ORDER — NITROGLYCERIN 0.4 MG SL SUBL: 0.4000 mg | SUBLINGUAL_TABLET | SUBLINGUAL | 11 refills | Status: AC | PRN

## 2021-01-10 MED ORDER — LOSARTAN POTASSIUM 25 MG PO TABS: 25.0000 mg | ORAL_TABLET | Freq: Every day | ORAL | 11 refills | Status: DC

## 2021-01-10 MED ORDER — ASPIRIN 81 MG PO TBEC: 81.0000 mg | DELAYED_RELEASE_TABLET | Freq: Every day | ORAL | 11 refills | Status: AC

## 2021-01-10 MED ORDER — CARVEDILOL 3.125 MG PO TABS: 3.1250 mg | ORAL_TABLET | Freq: Two times a day (BID) | ORAL | 11 refills | Status: DC

## 2021-01-10 MED ORDER — ATORVASTATIN CALCIUM 80 MG PO TABS: 80.0000 mg | ORAL_TABLET | Freq: Every day | ORAL | 11 refills | Status: DC

## 2021-01-10 MED ORDER — TICAGRELOR 90 MG PO TABS: 90.0000 mg | ORAL_TABLET | Freq: Two times a day (BID) | ORAL | 3 refills | Status: DC

## 2021-01-10 MED ORDER — CARVEDILOL 3.125 MG PO TABS
3.1250 mg | ORAL_TABLET | Freq: Two times a day (BID) | ORAL | Status: DC
  Administered 2021-01-10: 3.125 mg via ORAL
  Filled 2021-01-10: qty 1

## 2021-01-10 MED ORDER — LOSARTAN POTASSIUM 25 MG PO TABS: 25.0000 mg | ORAL_TABLET | Freq: Every day | ORAL | Status: DC

## 2021-01-10 NOTE — Progress Notes (Signed)
CARDIAC REHAB PHASE I   PRE:  Rate/Rhythm: 77 SR  BP:  Supine:   Sitting: 138/77  Standing:    SaO2: 99% RA  MODE:  Ambulation: 200 ft   POST:  Rate/Rhythm: 88 SR  BP:  Supine:   Sitting: 139/80  Standing:    SaO2: 99% RA Walked slowly independently, was limited by feeling short of breath. Instructed in purse lip breathing, it did not help much.  Reported to nurse re: SOB, not sure if it is from brillenta? Education completed re: MI, activity restrictions. Exercise progression, angina symptoms, NTG usage, when to call the doctor, referred to phase II cardiac rehab in [place].  Cindra Eves RN, BSN 01/10/2021 9:21 AM

## 2021-01-10 NOTE — Progress Notes (Signed)
Patient significantly short of breath during ambulation with cardiac rehab RN, recovered well when back in bed. Stated she did get short of breath after first dose of Brilinta given. Consulted PA with cards about this, directed to try giving Brilinta with caffeine to minimize SOB, and monitor.

## 2021-01-10 NOTE — Discharge Summary (Signed)
Discharge Summary    Patient ID: Debbie Frazier MRN: [id number]; DOB: 1955-10-10  Admit date: 01/08/2021 Discharge date: 01/10/2021  PCP:  Sigmund Hazel, MD   [facility]  Cardiologist:  Chrystie Nose, MD   Electrophysiologist:  None        Discharge Diagnoses    Principal Problem:   Non-STEMI (non-ST elevated myocardial infarction) [facility]) Active Problems:   Acute systolic heart failure (HCC)   Ischemic cardiomyopathy   CAD S/P percutaneous coronary angioplasty   Hyperlipidemia with target LDL less than 70    Diagnostic Studies/Procedures    Cardiac catheterization 01/09/2021  Ost LAD to Prox LAD lesion is 75% stenosed. Mid LAD lesion is 99% stenosed, which was likely the culprit for her ACS.  A drug-eluting stent was successfully placed using a SYNERGY XD 3.0X28 covering the entire diseased LAD segment from ostial to mid. The stent was postdilated with a 3.5 mm [place] balloon and optimized with OCT.  Post intervention, there is a 0% residual stenosis.  Ost Cx to Prox Cx lesion is 40% stenosed. This stenosis was worsened from plaque shift after postdilatation of the ostial LAD. TIMI-3 flow was maintained throughout.  The left ventricular systolic function is normal.  LV end diastolic pressure is normal.  The left ventricular ejection fraction is 55-65% by visual estimate.  There is no aortic valve stenosis.  Continue aggressive secondary prevention. She needs dual antiplatelet therapy ideally for 12 months. I spoke to the patient's daughter who has von Willebrand's disease. She states the mother has not been tested but does bruise easily. If she does have bleeding issues, could stop aspirin and use Brilinta monotherapy. If there were any bleeding issues that persisted, could consider switching to clopidogrel from Brilinta after a few months.  She will need high potency statin, regular exercise and whole food, plant-based  diet.       Echocardiogram 01/09/2021 1. Akinesis of the mid to apical septum, apical anterior and apex. Left  ventricular ejection fraction, by estimation, is 35 to 40%. The left  ventricle has moderately decreased function. The left ventricle  demonstrates regional wall motion abnormalities  (see scoring diagram/findings for description). There is mild asymmetric  left ventricular hypertrophy of the basal-septal segment. Left ventricular  diastolic parameters are consistent with Grade II diastolic dysfunction  (pseudonormalization).  2. Right ventricular systolic function is normal. The right ventricular  size is normal. There is normal pulmonary artery systolic pressure.  3. Left atrial size was mildly dilated.  4. The mitral valve is normal in structure. Trivial mitral valve  regurgitation. No evidence of mitral stenosis.  5. The aortic valve is tricuspid. Aortic valve regurgitation is mild. No  aortic stenosis is present.  6. The inferior vena cava is normal in size with greater than 50%  respiratory variability, suggesting right atrial pressure of 3 mmHg.   _____________   History of Present Illness     Debbie Frazier is a 65 y.o. female with a history of OSAon CPAP, depression,glaucoma,and HLD who presented 01/08/21 with chest pain and found to have NSTEMI (hs-Trop 1265 >> 1526).  She was admitted for further evaluation.    Hospital Course     Consultants: none    Non-STEMI  She was admitted and placed in IV heparin, ASA, beta-blocker, high dose atorvastatin. She underwent cardiac catheterization 01/09/2021 which demonstrated high grade disease in the proximal to mid LAD.  This was treated with a DES.  There was residual  mod disease in the LCx which is to be treated medically.  The Cath notes indicate there is a +Fhx of von Willebrand's disease.  If bleeding issues arise in the future, ASA could be DC in favor of monotherapy with Ticagrelor.  For now, she will  need to remain on DAPT for 12 mos with ASA and Ticagrelor.  She will continue high intensity statin, beta-blocker, angiotensin receptor blocker.  She was evaluated by Dr. Rennis Golden today and felt to be improved and stable for discharge to home.  She will need f/u with APP in 2-3 weeks and then f/u with Dr. Rennis Golden.  She has been encouraged to pursue outpatient cardiac rehabilitation.    Acute (HFrEF) heart failure with reduced ejection fraction  Ischemic CM The patient's EF was noted to be 35-40% with anterior and apical akinesis on echocardiogram.  EF was felt to be normal at cath but there was sub-optimal LV opacification.  LVEDP was normal at cath.  Hopefully, low EF is just stunning from MI and this will recover.  She was started on Losartan 25 mg once daily and Carvedilol 3.125 mg twice daily.  Pt will need repeat Echocardiogram in 3-6 mos. She will need a BMET in 2 weeks.   Hyperlipidemia  Atorvastatin dose was increased to 80 mg once daily this admission.  she will need f/u fasting lipids and LFTs in 8-12 weeks.  Goal LDL < 70.    Did the patient have an acute coronary syndrome (MI, NSTEMI, STEMI, etc) this admission?:  Yes                               AHA/ACC Clinical Performance & Quality Measures: 1. Aspirin prescribed? - Yes 2. ADP Receptor Inhibitor (Plavix/Clopidogrel, Brilinta/Ticagrelor or Effient/Prasugrel) prescribed (includes medically managed patients)? - Yes 3. Beta Blocker prescribed? - Yes 4. High Intensity Statin (Lipitor 40-80mg  or Crestor 20-40mg ) prescribed? - Yes 5. EF assessed during THIS hospitalization? - Yes 6. For EF <40%, was ACEI/ARB prescribed? - Yes 7. For EF <40%, Aldosterone Antagonist (Spironolactone or Eplerenone) prescribed? - Not Applicable (EF >/= 40%) 8. Cardiac Rehab Phase II ordered (including medically managed patients)? - Yes       _____________  Discharge Vitals Blood pressure 139/81, pulse 70, temperature 98 F (36.7 C), temperature source  Oral, resp. rate 20, height 5\' 5"  (1.651 m), weight 68.5 kg, SpO2 97 %.  Filed Weights   01/08/21 2005 01/09/21 0500 01/10/21 0604  Weight: 69.9 kg 69.3 kg 68.5 kg    Labs & Radiologic Studies    CBC Recent Labs    01/09/21 0228 01/10/21 0156  WBC 6.8 7.7  HGB 14.1 14.4  HCT 42.5 42.7  MCV 92.0 90.3  PLT 176 177   Basic Metabolic Panel Recent Labs    01/12/21 0228 01/10/21 0156  NA 139 139  K 3.9 4.3  CL 104 102  CO2 27 27  GLUCOSE 107* 110*  BUN 15 11  CREATININE 0.87 0.83  CALCIUM 9.4 9.7   High Sensitivity Troponin:   Recent Labs  Lab 01/08/21 1442 01/08/21 1817  TROPONINIHS 1,265* 1,526*    Fasting Lipid Panel Recent Labs    01/09/21 0228  CHOL 170  HDL 45  LDLCALC 91  TRIG 171*  CHOLHDL 3.8   _____________  DG Chest 2 View Result Date: 01/08/2021 CLINICAL DATA:  Chest pain. EXAM: CHEST - 2 VIEW COMPARISON:  No prior. FINDINGS: Mediastinum hilar  structures normal. Heart size normal. No focal infiltrate. No pleural effusion or pneumothorax. Degenerative change thoracic spine.  IMPRESSION: No acute cardiopulmonary disease.  Electronically Signed   By: Maisie Fushomas  Register   On: 01/08/2021 15:29      Disposition   Pt is being discharged home today in good condition.  Follow-up Plans & Appointments     Follow-up Information    Chrystie NoseHilty, Kenneth C, MD Follow up in 2 week(s).   Specialty: Cardiology Why: The office will call to arrange a follow up visit with one of the PAs or NPs on Dr. Blanchie DessertHilty's team.  We will also arrange a f/u lab test (BMET).  Contact information: [address] [place] [place] [address] [phone]        Sigmund HazelMiller, Lisa, MD Follow up.   Specialty: Family Medicine Contact information: [address] [place] [address] [phone]              Discharge Instructions    AMB Referral to Cardiac Rehabilitation - Phase II   Complete by: As directed    Diagnosis:  Coronary Stents NSTEMI     After  initial evaluation and assessments completed: Virtual Based Care may be provided alone or in conjunction with Phase 2 Cardiac Rehab based on patient barriers.: Yes   Amb Referral to Cardiac Rehabilitation   Complete by: As directed    Diagnosis:  Coronary Stents NSTEMI     After initial evaluation and assessments completed: Virtual Based Care may be provided alone or in conjunction with Phase 2 Cardiac Rehab based on patient barriers.: Yes   Diet - low sodium heart healthy   Complete by: As directed    Discharge wound care:   Complete by: As directed    Call for any swelling, bleeding, bruising or fever.   Driving Restrictions   Complete by: As directed    No driving for 2 weeks.   Increase activity slowly   Complete by: As directed    Lifting restrictions   Complete by: As directed    No lifting over 5 lbs for 2 weeks.   Sexual Activity Restrictions   Complete by: As directed    None for 2 weeks.      Discharge Medications   Allergies as of 01/10/2021      Reactions   Brimonidine Other (See Comments)   Pt was not able to tolerate       Medication List    STOP taking these medications   celecoxib 200 MG capsule Commonly known as: CELEBREX     TAKE these medications   acyclovir 200 MG capsule Commonly known as: ZOVIRAX Take 20 mg by mouth as needed (flare).   aspirin 81 MG EC tablet Take 1 tablet (81 mg total) by mouth daily. Swallow whole. Start taking on: Jan 11, 2021   atorvastatin 80 MG tablet Commonly known as: LIPITOR Take 1 tablet (80 mg total) by mouth daily. Start taking on: Jan 11, 2021 What changed:   medication strength  how much to take  when to take this   carvedilol 3.125 MG tablet Commonly known as: COREG Take 1 tablet (3.125 mg total) by mouth 2 (two) times daily with a meal.   clobetasol 0.05 % external solution Commonly known as: TEMOVATE Apply 1 application topically as needed (scalp).   fluorometholone 0.1 % ophthalmic  suspension Commonly known as: FML Place 1 drop into both eyes as needed (irritation).   hydrOXYzine 25 MG tablet Commonly known  as: ATARAX/VISTARIL Take 25 mg by mouth as needed for anxiety.   losartan 25 MG tablet Commonly known as: COZAAR Take 1 tablet (25 mg total) by mouth daily.   nitroGLYCERIN 0.4 MG SL tablet Commonly known as: NITROSTAT Place 1 tablet (0.4 mg total) under the tongue every 5 (five) minutes x 3 doses as needed for chest pain.   sertraline 100 MG tablet Commonly known as: ZOLOFT Take 100 mg by mouth at bedtime.   Synthroid 150 MCG tablet Generic drug: levothyroxine Take 150 mcg by mouth at bedtime.   ticagrelor 90 MG Tabs tablet Commonly known as: BRILINTA Take 1 tablet (90 mg total) by mouth 2 (two) times daily.   timolol 0.5 % ophthalmic solution Commonly known as: TIMOPTIC Place 1 drop into the left eye 2 (two) times daily.            Discharge Care Instructions  (From admission, onward)         Start     Ordered   01/10/21 0000  Discharge wound care:       Comments: Call for any swelling, bleeding, bruising or fever.   01/10/21 1118             Outstanding Labs/Studies   BMET 2 weeks  Duration of Discharge Encounter   Greater than 30 minutes including physician time.  Signed, Tereso Newcomer, PA-C 01/10/2021, 11:22 AM

## 2021-01-10 NOTE — Progress Notes (Addendum)
DAILY PROGRESS NOTE   Patient Name: Debbie Frazier Date of Encounter: 01/10/2021 Cardiologist: Chrystie Nose, MD  Chief Complaint   Feels well  Patient Profile   Debbie Frazier is a 65 y.o. female with a history of OSA on CPAP, depression, glaucoma, and HLD presented with chest pain found to have NSTEMI.  Subjective   Cath yesterday showed 75% ostial to proximal LAD and 99% mid-LAD stenosis - s/p synergy DES covering the entire diseased segment, post-dilated to 3.5 mm. There was a 40% LCx stenosis which will be treated medically. Unfortunately, echo shows anterior and apical akinesis with LVEF 35-40% - I suspect this may be stunning, hopefully, will recover with medical therapy. LVEF by cath was reported as 55-65% (however, LV opacification of dye was suboptimal).  Objective   Vitals:   01/09/21 1433 01/09/21 2051 01/09/21 2148 01/10/21 0604  BP: (!) 93/57 117/70  139/81  Pulse: 65 68 75 70  Resp: Temp: 98.1 F (36.7 C) 98.1 F (36.7 C)  98 F (36.7 C)  TempSrc: Oral Oral  Oral  SpO2: 96% 99%  97%  Weight:    68.5 kg  Height:        Intake/Output Summary (Last 24 hours) at 01/10/2021 1048 Last data filed at 01/09/2021 1600 Gross per 24 hour  Intake 442.67 ml  Output --  Net 442.67 ml   Filed Weights   01/08/21 2005 01/09/21 0500 01/10/21 0604  Weight: 69.9 kg 69.3 kg 68.5 kg    Physical Exam   General appearance: alert and no distress Neck: no carotid bruit, no JVD and thyroid not enlarged, symmetric, no tenderness/mass/nodules Lungs: clear to auscultation bilaterally Heart: regular rate and rhythm, S1, S2 normal, no murmur, click, rub or gallop Abdomen: soft, non-tender; bowel sounds normal; no masses,  no organomegaly Extremities: extremities normal, atraumatic, no cyanosis or edema and right radial cath site without hematoma or bruit Pulses: 2+ and symmetric Skin: Skin color, texture, turgor normal. No rashes or lesions Neurologic:  Grossly normal Psych: Pleasant  Inpatient Medications    Scheduled Meds: . aspirin EC  81 mg Oral Daily  . atorvastatin  80 mg Oral Daily  . carvedilol  3.125 mg Oral BID WC  . levothyroxine  150 mcg Oral Q0600  . losartan  25 mg Oral Daily  . sertraline  100 mg Oral QHS  . sodium chloride flush  3 mL Intravenous Q12H  . sodium chloride flush  3 mL Intravenous Q12H  . ticagrelor  90 mg Oral BID  . timolol  1 drop Left Eye BID    Continuous Infusions: . sodium chloride 75 mL/hr at 01/09/21 0841  . sodium chloride      PRN Meds: sodium chloride, acetaminophen, hydrOXYzine, nitroGLYCERIN, ondansetron (ZOFRAN) IV, sodium chloride flush   Labs   Results for orders placed or performed during the hospital encounter of 01/08/21 (from the past 48 hour(s))  Basic metabolic panel     Status: None   Collection Time: 01/08/21  2:42 PM  Result Value Ref Range   Sodium 139 135 - 145 mmol/L   Potassium 4.3 3.5 - 5.1 mmol/L   Chloride 106 98 - 111 mmol/L   CO2 24 22 - 32 mmol/L   Glucose, Bld 91 70 - 99 mg/dL    Comment: Glucose reference range applies only to samples taken after fasting for at least 8 hours.   BUN 11 8 - 23 mg/dL   Creatinine,  Ser 0.64 0.44 - 1.00 mg/dL   Calcium 9.1 8.9 - 78.4 mg/dL   GFR, Estimated >69 >62 mL/min    Comment: (NOTE) Calculated using the CKD-EPI Creatinine Equation (2021)    Anion gap 9 5 - 15    Comment: Performed at [facility], [address] [place], [place] [address]  CBC     Status: None   Collection Time: 01/08/21  2:42 PM  Result Value Ref Range   WBC 6.6 4.0 - 10.5 K/uL   RBC 4.69 3.87 - 5.11 MIL/uL   Hemoglobin 14.2 12.0 - 15.0 g/dL   HCT 13.2 44.0 - 10.2 %   MCV 90.4 80.0 - 100.0 fL   MCH 30.3 26.0 - 34.0 pg   MCHC 33.5 30.0 - 36.0 g/dL   RDW 72.5 36.6 - 44.0 %   Platelets 177 150 - 400 K/uL   nRBC 0.0 0.0 - 0.2 %    Comment: Performed at [facility], [address], [place], [place] [address]  Troponin I (High  Sensitivity)     Status: Abnormal   Collection Time: 01/08/21  2:42 PM  Result Value Ref Range   Troponin I (High Sensitivity) 1,265 (HH) <18 ng/L    Comment: CRITICAL RESULT CALLED TO, READ BACK BY AND VERIFIED WITH: Deatra Canter  1544 01/08/2021 WBOND (NOTE) Elevated high sensitivity troponin I (hsTnI) values and significant  changes across serial measurements may suggest ACS but many other  chronic and acute conditions are known to elevate hsTnI results.  Refer to the Links section for chest pain algorithms and additional  guidance. Performed at [facility], [address], [place], [place] [address]   Resp Panel by RT-PCR (Flu A&B, Covid) Nasopharyngeal Swab     Status: None   Collection Time: 01/08/21  5:06 PM   Specimen: Nasopharyngeal Swab; Nasopharyngeal(NP) swabs in vial transport medium  Result Value Ref Range   SARS Coronavirus 2 by RT PCR NEGATIVE NEGATIVE    Comment: (NOTE) SARS-CoV-2 target nucleic acids are NOT DETECTED.  The SARS-CoV-2 RNA is generally detectable in upper respiratory specimens during the acute phase of infection. The lowest concentration of SARS-CoV-2 viral copies this assay can detect is 138 copies/mL. A negative result does not preclude SARS-Cov-2 infection and should not be used as the sole basis for treatment or other patient management decisions. A negative result may occur with  improper specimen collection/handling, submission of specimen other than nasopharyngeal swab, presence of viral mutation(s) within the areas targeted by this assay, and inadequate number of viral copies(<138 copies/mL). A negative result must be combined with clinical observations, patient history, and epidemiological information. The expected result is Negative.  Fact Sheet for Patients:  BloggerCourse.com  Fact Sheet for Healthcare Providers:  SeriousBroker.it  This test is no t yet approved or cleared by  the [place] FDA and  has been authorized for detection and/or diagnosis of SARS-CoV-2 by FDA under an Emergency Use Authorization (EUA). This EUA will remain  in effect (meaning this test can be used) for the duration of the COVID-19 declaration under Section 564(b)(1) of the Act, 21 U.S.C.section 360bbb-3(b)(1), unless the authorization is terminated  or revoked sooner.       Influenza A by PCR NEGATIVE NEGATIVE   Influenza B by PCR NEGATIVE NEGATIVE    Comment: (NOTE) The Xpert Xpress SARS-CoV-2/FLU/RSV plus assay is intended as an aid in the diagnosis of influenza from Nasopharyngeal swab specimens and should not be  used as a sole basis for treatment. Nasal washings and aspirates are unacceptable for Xpert Xpress SARS-CoV-2/FLU/RSV testing.  Fact Sheet for Patients: BloggerCourse.com  Fact Sheet for Healthcare Providers: SeriousBroker.it  This test is not yet approved or cleared by the [place] FDA and has been authorized for detection and/or diagnosis of SARS-CoV-2 by FDA under an Emergency Use Authorization (EUA). This EUA will remain in effect (meaning this test can be used) for the duration of the COVID-19 declaration under Section 564(b)(1) of the Act, 21 U.S.C. section 360bbb-3(b)(1), unless the authorization is terminated or revoked.  Performed at [facility], [address], [place], [place] [address]   Troponin I (High Sensitivity)     Status: Abnormal   Collection Time: 01/08/21  6:17 PM  Result Value Ref Range   Troponin I (High Sensitivity) 1,526 (HH) <18 ng/L    Comment: CRITICAL VALUE NOTED.  VALUE IS CONSISTENT WITH PREVIOUSLY REPORTED AND CALLED VALUE. (NOTE) Elevated high sensitivity troponin I (hsTnI) values and significant  changes across serial measurements may suggest ACS but many other  chronic and acute conditions are known to elevate hsTnI results.  Refer to the Links section for  chest pain algorithms and additional  guidance. Performed at [facility], [address] [place], [place] [address]   Heparin level (unfractionated)     Status: None   Collection Time: 01/08/21 10:40 PM  Result Value Ref Range   Heparin Unfractionated 0.44 0.30 - 0.70 IU/mL    Comment: (NOTE) The clinical reportable range upper limit is being lowered to >1.10 to align with the FDA approved guidance for the current laboratory assay.  If heparin results are below expected values, and patient dosage has  been confirmed, suggest follow up testing of antithrombin III levels. Performed at [facility], [address] [place], [place] [address]   HIV Antibody (routine testing w rflx)     Status: None   Collection Time: 01/08/21 10:40 PM  Result Value Ref Range   HIV Screen 4th Generation wRfx Non Reactive Non Reactive    Comment: Performed at [facility], [address], [place], [place] [address]  Basic metabolic panel     Status: Abnormal   Collection Time: 01/09/21  2:28 AM  Result Value Ref Range   Sodium 139 135 - 145 mmol/L   Potassium 3.9 3.5 - 5.1 mmol/L   Chloride 104 98 - 111 mmol/L   CO2 27 22 - 32 mmol/L   Glucose, Bld 107 (H) 70 - 99 mg/dL    Comment: Glucose reference range applies only to samples taken after fasting for at least 8 hours.   BUN 15 8 - 23 mg/dL   Creatinine, Ser 5.36 0.44 - 1.00 mg/dL   Calcium 9.4 8.9 - 64.4 mg/dL   GFR, Estimated >03 >47 mL/min    Comment: (NOTE) Calculated using the CKD-EPI Creatinine Equation (2021)    Anion gap 8 5 - 15    Comment: Performed at [facility], [address] [place], [place] [address]  Lipid panel     Status: Abnormal   Collection Time: 01/09/21  2:28 AM  Result Value Ref Range   Cholesterol 170 0 - 200 mg/dL   Triglycerides 638 (H) <150 mg/dL   HDL 45 >75 mg/dL   Total CHOL/HDL Ratio 3.8 RATIO   VLDL 34 0 - 40 mg/dL   LDL Cholesterol 91 0 - 99 mg/dL    Comment:  Total  Cholesterol/HDL:CHD Risk Coronary Heart Disease Risk Table                     Men   Women  1/2 Average Risk   3.4   3.3  Average Risk       5.0   4.4  2 X Average Risk   9.6   7.1  3 X Average Risk  23.4   11.0        Use the calculated Patient Ratio above and the CHD Risk Table to determine the patient's CHD Risk.        ATP III CLASSIFICATION (LDL):  <100     mg/dL   Optimal  098-119  mg/dL   Near or Above                    Optimal  130-159  mg/dL   Borderline  147-829  mg/dL   High  >562     mg/dL   Very High Performed at [facility], [address] [place], [place] [address]   CBC     Status: None   Collection Time: 01/09/21  2:28 AM  Result Value Ref Range   WBC 6.8 4.0 - 10.5 K/uL   RBC 4.62 3.87 - 5.11 MIL/uL   Hemoglobin 14.1 12.0 - 15.0 g/dL   HCT 57.8 46.9 - 62.9 %   MCV 92.0 80.0 - 100.0 fL   MCH 30.5 26.0 - 34.0 pg   MCHC 33.2 30.0 - 36.0 g/dL   RDW 52.8 41.3 - 24.4 %   Platelets 176 150 - 400 K/uL   nRBC 0.0 0.0 - 0.2 %    Comment: Performed at [facility], [address] [place], [place] [address]  Heparin level (unfractionated)     Status: None   Collection Time: 01/09/21  2:28 AM  Result Value Ref Range   Heparin Unfractionated 0.38 0.30 - 0.70 IU/mL    Comment: (NOTE) The clinical reportable range upper limit is being lowered to >1.10 to align with the FDA approved guidance for the current laboratory assay.  If heparin results are below expected values, and patient dosage has  been confirmed, suggest follow up testing of antithrombin III levels. Performed at [facility], [address] [place], [place] [address]   POCT Activated clotting time     Status: None   Collection Time: 01/09/21 10:31 AM  Result Value Ref Range   Activated Clotting Time 374 seconds  Basic metabolic panel     Status: Abnormal   Collection Time: 01/10/21  1:56 AM  Result Value Ref Range   Sodium 139 135 - 145 mmol/L   Potassium 4.3 3.5 - 5.1  mmol/L   Chloride 102 98 - 111 mmol/L   CO2 27 22 - 32 mmol/L   Glucose, Bld 110 (H) 70 - 99 mg/dL    Comment: Glucose reference range applies only to samples taken after fasting for at least 8 hours.   BUN 11 8 - 23 mg/dL   Creatinine, Ser 4.40 0.44 - 1.00 mg/dL   Calcium 9.7 8.9 - 34.7 mg/dL   GFR, Estimated >42 >59 mL/min    Comment: (NOTE) Calculated using the CKD-EPI Creatinine Equation (2021)    Anion gap 10 5 - 15    Comment: Performed at [facility], [address], [place], [place] [address]  CBC     Status: None  Collection Time: 01/10/21  1:56 AM  Result Value Ref Range   WBC 7.7 4.0 - 10.5 K/uL   RBC 4.73 3.87 - 5.11 MIL/uL   Hemoglobin 14.4 12.0 - 15.0 g/dL   HCT 09.3 81.8 - 29.9 %   MCV 90.3 80.0 - 100.0 fL   MCH 30.4 26.0 - 34.0 pg   MCHC 33.7 30.0 - 36.0 g/dL   RDW 37.1 69.6 - 78.9 %   Platelets 177 150 - 400 K/uL   nRBC 0.0 0.0 - 0.2 %    Comment: Performed at [facility], [address] [place], [place] [address]    ECG   N/A  Telemetry   Sinus rhythm - Personally Reviewed  Radiology    DG Chest 2 View  Result Date: 01/08/2021 CLINICAL DATA:  Chest pain. EXAM: CHEST - 2 VIEW COMPARISON:  No prior. FINDINGS: Mediastinum hilar structures normal. Heart size normal. No focal infiltrate. No pleural effusion or pneumothorax. Degenerative change thoracic spine. IMPRESSION: No acute cardiopulmonary disease. Electronically Signed   By: Maisie Fus  Register   On: 01/08/2021 15:29   CARDIAC CATHETERIZATION  Addendum Date: 01/09/2021    Ost LAD to Prox LAD lesion is 75% stenosed. Mid LAD lesion is 99% stenosed, which was likely the culprit for her ACS.  A drug-eluting stent was successfully placed using a SYNERGY XD 3.0X28 covering the entire diseased LAD segment from ostial to mid. The stent was postdilated with a 3.5 mm [place] balloon and optimized with OCT.  Post intervention, there is a 0% residual stenosis.  Ost Cx to Prox Cx lesion is 40%  stenosed. This stenosis was worsened from plaque shift after postdilatation of the ostial LAD. TIMI-3 flow was maintained throughout.  The left ventricular systolic function is normal.  LV end diastolic pressure is normal.  The left ventricular ejection fraction is 55-65% by visual estimate.  There is no aortic valve stenosis.  Continue aggressive secondary prevention.  She needs dual antiplatelet therapy ideally for 12 months.  I spoke to the patient's daughter who has von Willebrand's disease.  She states the mother has not been tested but does bruise easily.  If she does have bleeding issues, could stop aspirin and use Brilinta monotherapy.  If there were any bleeding issues that persisted, could consider switching to clopidogrel from Brilinta after a few months. She will need high potency statin, regular exercise and whole food, plant-based diet.   Result Date: 01/09/2021  Ost LAD to Prox LAD lesion is 75% stenosed. Mid LAD lesion is 99% stenosed, which was likely the culprit for her ACS.  A drug-eluting stent was successfully placed using a SYNERGY XD 3.0X28 covering the entire diseased LAD segment from ostial to mid. The stent was postdilated with a 3.5 mm [place] balloon and optimized with OCT.  Post intervention, there is a 0% residual stenosis.  Ost Cx to Prox Cx lesion is 40% stenosed. This stenosis was worsened from plaque shift after postdilatation of the ostial LAD. TIMI-3 flow was maintained throughout.  The left ventricular systolic function is normal.  LV end diastolic pressure is normal.  The left ventricular ejection fraction is 55-65% by visual estimate.  There is no aortic valve stenosis.  Post intervention, there is a 0% residual stenosis.  Continue aggressive secondary prevention.  She needs dual antiplatelet therapy ideally for 12 months.  I spoke to the patient's daughter who has von Willebrand's disease.  She states the mother has not been tested but does bruise  easily.  If she  does have bleeding issues, could stop aspirin and use Brilinta monotherapy.  If there were any bleeding issues that persisted, could consider switching to clopidogrel from Brilinta after a few months. She will need high potency statin, regular exercise and whole food, plant-based diet.   ECHOCARDIOGRAM COMPLETE  Result Date: 01/09/2021    ECHOCARDIOGRAM REPORT   Patient Name:   Debbie Frazier Date of Exam: 01/09/2021 Medical Rec #:  [id number]          Height:       65.0 in Accession #:    [id number]         Weight:       152.7 lb Date of Birth:  [birth year]          BSA:          1.764 m Patient Age:    64 years           BP:           114/77 mmHg Patient Gender: F                  HR:           62 bpm. Exam Location:  Inpatient Procedure: 2D Echo, Cardiac Doppler and Color Doppler Indications:    Chest Pain  History:        Patient has no prior history of Echocardiogram examinations.  Sonographer:    Shirlean Kelly Referring Phys: [id number] ANGELA NICOLE DUKE IMPRESSIONS  1. Akinesis of the mid to apical septum, apical anterior and apex. Left ventricular ejection fraction, by estimation, is 35 to 40%. The left ventricle has moderately decreased function. The left ventricle demonstrates regional wall motion abnormalities (see scoring diagram/findings for description). There is mild asymmetric left ventricular hypertrophy of the basal-septal segment. Left ventricular diastolic parameters are consistent with Grade II diastolic dysfunction (pseudonormalization).  2. Right ventricular systolic function is normal. The right ventricular size is normal. There is normal pulmonary artery systolic pressure.  3. Left atrial size was mildly dilated.  4. The mitral valve is normal in structure. Trivial mitral valve regurgitation. No evidence of mitral stenosis.  5. The aortic valve is tricuspid. Aortic valve regurgitation is mild. No aortic stenosis is present.  6. The inferior vena cava is normal in size with greater  than 50% respiratory variability, suggesting right atrial pressure of 3 mmHg. FINDINGS  Left Ventricle: Akinesis of the mid to apical septum, apical anterior and apex. Left ventricular ejection fraction, by estimation, is 35 to 40%. The left ventricle has moderately decreased function. The left ventricle demonstrates regional wall motion abnormalities. The left ventricular internal cavity size was normal in size. There is mild asymmetric left ventricular hypertrophy of the basal-septal segment. Left ventricular diastolic parameters are consistent with Grade II diastolic dysfunction (pseudonormalization). Indeterminate filling pressures. Right Ventricle: The right ventricular size is normal. No increase in right ventricular wall thickness. Right ventricular systolic function is normal. There is normal pulmonary artery systolic pressure. The tricuspid regurgitant velocity is 1.85 m/s, and  with an assumed right atrial pressure of 3 mmHg, the estimated right ventricular systolic pressure is 16.7 mmHg. Left Atrium: Left atrial size was mildly dilated. Right Atrium: Right atrial size was normal in size. Pericardium: There is no evidence of pericardial effusion. Mitral Valve: The mitral valve is normal in structure. Trivial mitral valve regurgitation. No evidence of mitral valve stenosis. Tricuspid Valve: The tricuspid valve is normal in structure. Tricuspid valve  regurgitation is trivial. No evidence of tricuspid stenosis. Aortic Valve: The aortic valve is tricuspid. Aortic valve regurgitation is mild. No aortic stenosis is present. Aortic valve mean gradient measures 3.0 mmHg. Aortic valve peak gradient measures 5.2 mmHg. Aortic valve area, by VTI measures 1.36 cm. Pulmonic Valve: The pulmonic valve was normal in structure. Pulmonic valve regurgitation is not visualized. No evidence of pulmonic stenosis. Aorta: The aortic root is normal in size and structure. Venous: The inferior vena cava is normal in size with  greater than 50% respiratory variability, suggesting right atrial pressure of 3 mmHg. IAS/Shunts: No atrial level shunt detected by color flow Doppler.  LEFT VENTRICLE PLAX 2D LVIDd:         4.40 cm  Diastology LVIDs:         2.90 cm  LV e' medial:    5.77 cm/s LV PW:         1.00 cm  LV E/e' medial:  10.3 LV IVS:        1.10 cm  LV e' lateral:   7.40 cm/s LVOT diam:     1.80 cm  LV E/e' lateral: 8.1 LV SV:         33 LV SV Index:   18 LVOT Area:     2.54 cm  RIGHT VENTRICLE RV S prime:     9.03 cm/s TAPSE (M-mode): 1.9 cm LEFT ATRIUM             Index       RIGHT ATRIUM           Index LA diam:        3.50 cm 1.98 cm/m  RA Area:     10.90 cm LA Vol (A2C):   59.7 ml 33.85 ml/m RA Volume:   26.80 ml  15.19 ml/m LA Vol (A4C):   39.2 ml 22.22 ml/m LA Biplane Vol: 57.6 ml 32.66 ml/m  AORTIC VALVE AV Area (Vmax):    1.60 cm AV Area (Vmean):   1.43 cm AV Area (VTI):     1.36 cm AV Vmax:           114.00 cm/s AV Vmean:          86.500 cm/s AV VTI:            0.240 m AV Peak Grad:      5.2 mmHg AV Mean Grad:      3.0 mmHg LVOT Vmax:         71.90 cm/s LVOT Vmean:        48.600 cm/s LVOT VTI:          0.128 m LVOT/AV VTI ratio: 0.53  AORTA Ao Root diam: 3.00 cm Ao Asc diam:  2.50 cm MITRAL VALVE               TRICUSPID VALVE MV Area (PHT): 3.50 cm    TR Peak grad:   13.7 mmHg MV Decel Time: 217 msec    TR Vmax:        185.00 cm/s MV E velocity: 59.70 cm/s MV A velocity: 51.90 cm/s  SHUNTS MV E/A ratio:  1.15        Systemic VTI:  0.13 m                            Systemic Diam: 1.80 cm Chilton Siiffany Sunshine MD Electronically signed by Chilton Siiffany Dudley MD Signature Date/Time: 01/09/2021/3:30:54 PM    Final  Cardiac Studies   Echo above  Assessment   Principal Problem:   Non-STEMI (non-ST elevated myocardial infarction) (HCC) Active Problems:   Acute systolic heart failure (HCC)   Ischemic cardiomyopathy   Hyperlipidemia with target LDL less than 70   CAD S/P percutaneous coronary angioplasty   Plan    1. Feels much better after PCI to the proximal to mid-LAD yesterday. Unfortunately, there is an ischemic cardiomyopathy with LVEF 35-40% - hopefully this is ischemic stunning and will recover with revascularization. She is on asa, Brillinta, atorvastatin 80 mg, and carvedilol. Will add low dose losartan 25 mg daily. Ok to d/c home today. Encouraged cardiac rehab. Will need follow-up in 2-3 weeks with APP and then ultimately with me. Likely repeat echo in 3-6 months.  Time Spent Directly with Patient:  I have spent a total of 25 minutes with the patient reviewing hospital notes, telemetry, EKGs, labs and examining the patient as well as establishing an assessment and plan that was discussed personally with the patient.  > 50% of time was spent in direct patient care.  Length of Stay:  LOS: 2 days   Chrystie Nose, MD, [facility], FACP  [place]  [facility]  Medical Director of the [facility] Lipid [facility]  Cardiovascular Risk Reduction Clinic Diplomate of the American Board of Clinical Lipidology Attending Cardiologist  Direct Dial: [phone]  Fax: [phone]  Website:  [email or web address] Nicely Jamariah Tony 01/10/2021, 10:48 AM

## 2021-01-10 NOTE — Care Management (Signed)
Provided patient with Brilinta coupon 

## 2021-01-10 NOTE — Discharge Instructions (Signed)
Do not miss a dose of Brilinta.  This medication in combination with aspirin is very important.  It is a blood thinner and the combination of aspirin and Brilinta help the new stent that was implanted stay open.

## 2021-01-16 ENCOUNTER — Telehealth (HOSPITAL_COMMUNITY): Payer: Self-pay

## 2021-01-16 ENCOUNTER — Telehealth: Payer: Self-pay | Admitting: Internal Medicine

## 2021-01-16 NOTE — Telephone Encounter (Signed)
Called patient, advised that she should keep that appointment and if they needed a colonoscopy they would let us know via clearance.

## 2021-01-16 NOTE — Telephone Encounter (Signed)
Patient states she has an appointment with the GI doctor. She is not sure if she needs to reschedule it since she just became a patient of Dr. Rennis Golden. She states she is not sure if it is just a screening or if it is the actual colonoscopy, but she will call them to find out.

## 2021-01-16 NOTE — Telephone Encounter (Signed)
Follow Up:      Pt is returning Jenna's Bullins call from this morning.

## 2021-01-16 NOTE — Telephone Encounter (Signed)
Pt insurance is active and benefits verified through [facility]. Co-pay $20.00, DED $0.00/$0.00 met, out of pocket $4,000.00/$140.00 met, co-insurance 0%. No pre-authorization required. Passport, 01/16/21 @ 12:08PM, ASU#[id number]  Will contact patient to see if she is interested in the Cardiac Rehab Program. If interested, patient will need to complete follow up appt. Once completed, patient will be contacted for scheduling upon review by the RN Navigator.

## 2021-01-16 NOTE — Telephone Encounter (Signed)
Called patient to see if she is interested in the Cardiac Rehab Program. Patient expressed interest. Explained scheduling process and went over insurance, patient verbalized understanding. Will contact patient for scheduling once f/u has been completed. 

## 2021-01-16 NOTE — Telephone Encounter (Signed)
Attempted to call patient, left message for patient to call back to office.   

## 2021-01-21 ENCOUNTER — Telehealth: Payer: Self-pay | Admitting: Internal Medicine

## 2021-01-21 NOTE — Telephone Encounter (Signed)
Patient would like to know what she can take for joint pain. She states she is not sure, but thinks she is not supposed to take ibuprofen.

## 2021-01-21 NOTE — Telephone Encounter (Signed)
Called patient, advised of message from PharmD.  Patient verbalized understanding.   

## 2021-01-21 NOTE — Telephone Encounter (Signed)
Agree pt should avoid NSAIDs like ibuprofen since they can increase her risk of GI bleeding (she already takes Brilinta and aspirin), increase her blood pressure which was already elevated at her last visit, and can increase the risk of cardiovascular disease which she already has.  Would recommend that she try Tylenol to see if it helps with her pain.

## 2021-01-26 ENCOUNTER — Ambulatory Visit: Payer: Medicare PPO | Admitting: Physician Assistant

## 2021-01-26 ENCOUNTER — Encounter: Payer: Self-pay | Admitting: Physician Assistant

## 2021-01-26 ENCOUNTER — Other Ambulatory Visit: Payer: Self-pay

## 2021-01-26 VITALS — BP 108/66 | HR 66 | Ht 65.0 in | Wt 151.0 lb

## 2021-01-26 DIAGNOSIS — I25119 Atherosclerotic heart disease of native coronary artery with unspecified angina pectoris: Secondary | ICD-10-CM | POA: Diagnosis not present

## 2021-01-26 DIAGNOSIS — I255 Ischemic cardiomyopathy: Secondary | ICD-10-CM

## 2021-01-26 DIAGNOSIS — E785 Hyperlipidemia, unspecified: Secondary | ICD-10-CM | POA: Diagnosis not present

## 2021-01-26 NOTE — Progress Notes (Signed)
Cardiology Office Note:    Date:  01/29/2021   ID:  Debbie Frazier, DOB [birth year], MRN [id number]  PCP:  Sigmund Hazel, MD   [facility] Providers Cardiologist:  Chrystie Nose, MD {    Referring MD: Sigmund Hazel, MD   Chief Complaint  Patient presents with  . Follow-up    Seen for Dr. Rennis Golden    History of Present Illness:    Debbie Frazier is a 65 y.o. female with a hx of obstructive sleep apnea on CPAP therapy, depression, and hyperlipidemia.  She has significant family history of early CAD with her father having bypass surgery in his 78s.  More recently, she presented to hospital with chest and jaw pain and found to have elevated troponin consistent with NSTEMI.  Subsequently cardiac catheterization performed on 01/09/2021 revealed 75% ostial to proximal LAD, 99% mid LAD lesion treated with 3.0 x 28 mm DES covering the entire diseased LAD segment from the ostial to mid section, 40% ostial to proximal left circumflex lesion EF 55 to 65%.  Postprocedure, patient was placed on aspirin and Brilinta therapy.  She does have a daughter who has von Willebrand's disease, the patient herself has not been tested however does bruise easily.  Per Dr. Hoyle Barr recommendation, if she does have develop any bleeding issue, we can potentially consider switching from Brilinta to Plavix after a few months.  Echocardiogram performed postprocedure on 01/09/2021 showed EF 35 to 40%, mild asymmetric LVH of basal septal segment, trivial MR, mild AI.  The decreased LV function was felt to be related to myocardial stunning.  EF by cath was 55 to 65% on the same day.  Hopefully with optimal medical therapy, we will see recovery of the ejection fraction.  During this admission, serial troponin trended up to 1526.  Lipid panel obtained during this admission showed a total cholesterol 170, HDL 45, HDL 91, triglyceride 171.  Patient presents today for follow-up.  She has been compliant with aspirin and Brilinta.   She denies any recurrent chest pain.  She is currently on carvedilol and losartan for LV dysfunction.  I do not think she can tolerate higher dose of heart failure medication.  Unable to further uptitrate.  She will need fasting lipid panel and LFT in 6 to 8 weeks.  Prior to her 15-month follow-up, she will need echocardiogram to reassess ejection fraction.   Past Medical History:  Diagnosis Date  . Coronary Artery Disease 01/10/2021   NSTEMI 12/2020 - Cath: pLAD 75, mLAD 99 >> s/p DES; oLCx 40  . Depression   . HFrEF (heart failure with reduced ejection fraction)    Ischemic CM // Echo 4/22: apical / apical ant / apical septal AK; EF 35-40, mild asymmetric LVH, Gr 2 DD, normal RVSF, mild LAE, trivial MR, mild AI  . High cholesterol   . Hypothyroidism   . Non-ST elevated myocardial infarction 01/08/2021   12/2020 - S/p 3 x 28 mm Synergy DES to prox-mid LAD  . OSA on CPAP     Past Surgical History:  Procedure Laterality Date  . ARTHROPLASTY    . CARPAL TUNNEL RELEASE Right   . CORONARY STENT INTERVENTION N/A 01/09/2021   Procedure: CORONARY STENT INTERVENTION;  Surgeon: Corky Crafts, MD;  Location: [facility] INVASIVE CV LAB;  Service: Cardiovascular;  Laterality: N/A;  . EYE SURGERY    . INTRAVASCULAR IMAGING/OCT N/A 01/09/2021   Procedure: INTRAVASCULAR IMAGING/OCT;  Surgeon: Corky Crafts, MD;  Location: [facility] INVASIVE CV  LAB;  Service: Cardiovascular;  Laterality: N/A;  . JOINT REPLACEMENT    . LEFT HEART CATH AND CORONARY ANGIOGRAPHY N/A 01/09/2021   Procedure: LEFT HEART CATH AND CORONARY ANGIOGRAPHY;  Surgeon: Corky Crafts, MD;  Location: [facility] INVASIVE CV LAB;  Service: Cardiovascular;  Laterality: N/A;  . SEPTOPLASTY      Current Medications: Current Meds  Medication Sig  . acyclovir (ZOVIRAX) 200 MG capsule Take 20 mg by mouth as needed (flare).  Marland Kitchen aspirin EC 81 MG EC tablet Take 1 tablet (81 mg total) by mouth daily. Swallow whole.  Marland Kitchen atorvastatin (LIPITOR) 80 MG  tablet Take 1 tablet (80 mg total) by mouth daily.  . carvedilol (COREG) 3.125 MG tablet Take 1 tablet (3.125 mg total) by mouth 2 (two) times daily with a meal.  . clobetasol (TEMOVATE) 0.05 % external solution Apply 1 application topically as needed (scalp).  . fluorometholone (FML) 0.1 % ophthalmic suspension Place 1 drop into both eyes as needed (irritation).  . hydrOXYzine (ATARAX/VISTARIL) 25 MG tablet Take 25 mg by mouth as needed for anxiety.  Marland Kitchen losartan (COZAAR) 25 MG tablet Take 1 tablet (25 mg total) by mouth daily.  . nitroGLYCERIN (NITROSTAT) 0.4 MG SL tablet Place 1 tablet (0.4 mg total) under the tongue every 5 (five) minutes x 3 doses as needed for chest pain.  Marland Kitchen sertraline (ZOLOFT) 100 MG tablet Take 100 mg by mouth daily. Take 1.5 Tablets at Bedtime  . SYNTHROID 150 MCG tablet Take 150 mcg by mouth at bedtime.  . ticagrelor (BRILINTA) 90 MG TABS tablet Take 1 tablet (90 mg total) by mouth 2 (two) times daily.  . timolol (TIMOPTIC) 0.5 % ophthalmic solution Place 1 drop into the left eye 2 (two) times daily.     Allergies:   Brimonidine   Social History   Socioeconomic History  . Marital status: Divorced    Spouse name: Not on file  . Number of children: Not on file  . Years of education: Not on file  . Highest education level: Not on file  Occupational History  . Not on file  Tobacco Use  . Smoking status: Never Smoker  . Smokeless tobacco: Never Used  Vaping Use  . Vaping Use: Never used  Substance and Sexual Activity  . Alcohol use: Yes    Alcohol/week: 12.0 standard drinks    Types: 12 Cans of beer per week  . Drug use: Not on file  . Sexual activity: Not on file  Other Topics Concern  . Not on file  Social History Narrative  . Not on file   Social Determinants of Health   Financial Resource Strain: Not on file  Food Insecurity: Not on file  Transportation Needs: Not on file  Physical Activity: Not on file  Stress: Not on file  Social Connections:  Not on file     Family History: The patient's family history includes CAD in her father and maternal grandfather.  ROS:   Please see the history of present illness.     All other systems reviewed and are negative.  EKGs/Labs/Other Studies Reviewed:    The following studies were reviewed today:  Cath 01/09/2021  Ost LAD to Prox LAD lesion is 75% stenosed. Mid LAD lesion is 99% stenosed, which was likely the culprit for her ACS.  A drug-eluting stent was successfully placed using a SYNERGY XD 3.0X28 covering the entire diseased LAD segment from ostial to mid. The stent was postdilated with a 3.5 mm [place]  balloon and optimized with OCT.  Post intervention, there is a 0% residual stenosis.  Ost Cx to Prox Cx lesion is 40% stenosed. This stenosis was worsened from plaque shift after postdilatation of the ostial LAD. TIMI-3 flow was maintained throughout.  The left ventricular systolic function is normal.  LV end diastolic pressure is normal.  The left ventricular ejection fraction is 55-65% by visual estimate.  There is no aortic valve stenosis.   Continue aggressive secondary prevention.  She needs dual antiplatelet therapy ideally for 12 months.  I spoke to the patient's daughter who has von Willebrand's disease.  She states the mother has not been tested but does bruise easily.  If she does have bleeding issues, could stop aspirin and use Brilinta monotherapy.  If there were any bleeding issues that persisted, could consider switching to clopidogrel from Brilinta after a few months.  She will need high potency statin, regular exercise and whole food, plant-based diet.   Echo 01/09/2021 1. Akinesis of the mid to apical septum, apical anterior and apex. Left  ventricular ejection fraction, by estimation, is 35 to 40%. The left  ventricle has moderately decreased function. The left ventricle  demonstrates regional wall motion abnormalities  (see scoring diagram/findings for  description). There is mild asymmetric  left ventricular hypertrophy of the basal-septal segment. Left ventricular  diastolic parameters are consistent with Grade II diastolic dysfunction  (pseudonormalization).  2. Right ventricular systolic function is normal. The right ventricular  size is normal. There is normal pulmonary artery systolic pressure.  3. Left atrial size was mildly dilated.  4. The mitral valve is normal in structure. Trivial mitral valve  regurgitation. No evidence of mitral stenosis.  5. The aortic valve is tricuspid. Aortic valve regurgitation is mild. No  aortic stenosis is present.  6. The inferior vena cava is normal in size with greater than 50%  respiratory variability, suggesting right atrial pressure of 3 mmHg.    EKG:  EKG is ordered today.  The ekg ordered today demonstrates normal sinus rhythm with T wave inversion in the anterior leads.  Recent Labs: 01/10/2021: BUN 11; Creatinine, Ser 0.83; Hemoglobin 14.4; Platelets 177; Potassium 4.3; Sodium 139  Recent Lipid Panel    Component Value Date/Time   CHOL 170 01/09/2021 0228   TRIG 171 (H) 01/09/2021 0228   HDL 45 01/09/2021 0228   CHOLHDL 3.8 01/09/2021 0228   VLDL 34 01/09/2021 0228   LDLCALC 91 01/09/2021 0228     Risk Assessment/Calculations:       Physical Exam:    VS:  BP 108/66   Pulse 66   Ht 5\' 5"  (1.651 m)   Wt 151 lb (68.5 kg)   SpO2 98%   BMI 25.13 kg/m     Wt Readings from Last 3 Encounters:  01/26/21 151 lb (68.5 kg)  01/10/21 151 lb (68.5 kg)     GEN:  Well nourished, well developed in no acute distress HEENT: Normal NECK: No JVD; No carotid bruits LYMPHATICS: No lymphadenopathy CARDIAC: RRR, no murmurs, rubs, gallops RESPIRATORY:  Clear to auscultation without rales, wheezing or rhonchi  ABDOMEN: Soft, non-tender, non-distended MUSCULOSKELETAL:  No edema; No deformity  SKIN: Warm and dry NEUROLOGIC:  Alert and oriented x 3 PSYCHIATRIC:  Normal affect    ASSESSMENT:    1. Coronary artery disease involving native coronary artery of native heart with angina pectoris (HCC)   2. Hyperlipidemia, unspecified hyperlipidemia type   3. Ischemic cardiomyopathy    PLAN:  In order of problems listed above:  1. CAD: Recently underwent PCI of LAD lesion.  EKG continue to show significant T wave reemerging in the anterior leads, this is persistent from her admission EKG.  Continue aspirin and Brilinta.  We emphasized on the importance of compliance with dual antiplatelet therapy.  2. Hyperlipidemia: On Lipitor.  Fasting lipid panel LFT in 6 to 8 weeks  3. Ischemic cardiomyopathy: EF 35% by echocardiogram.  Interestingly, her ejection fraction was normal on cardiac cath.  She is on carvedilol and losartan.  We will need repeat echocardiogram in 3 months prior to her next follow-up.      Medication Adjustments/Labs and Tests Ordered: Current medicines are reviewed at length with the patient today.  Concerns regarding medicines are outlined above.  Orders Placed This Encounter  Procedures  . Lipid panel  . Hepatic function panel  . EKG 12-Lead  . ECHOCARDIOGRAM COMPLETE   No orders of the defined types were placed in this encounter.   Patient Instructions  Medication Instructions:  Your physician recommends that you continue on your current medications as directed. Please refer to the Current Medication list given to you today.  *If you need a refill on your cardiac medications before your next appointment, please call your pharmacy*  Lab Work: Your physician recommends that you return for lab work in 6-8 WEEKS-03/09/21:   Fasting Lipid Panel-DO NOT EAT OR DRINK PAST MIDNIGHT. OKAY TO DRINK WATER.   Hepatic (Liver) Function  If you have labs (blood work) drawn today and your tests are completely normal, you will receive your results only by: Marland Kitchen MyChart Message (if you have MyChart) OR . A paper copy in the mail If you have any lab  test that is abnormal or we need to change your treatment, we will call you to review the results.  Testing/Procedures: Your physician has requested that you have an echocardiogram. Echocardiography is a painless test that uses sound waves to create images of your heart. It provides your doctor with information about the size and shape of your heart and how well your heart's chambers and valves are working. This procedure takes approximately one hour. There are no restrictions for this procedure. THIS TEST IS PERFORMED AT [address], [place], [place] [address]   PLEASE SCHEDULE FOR 3 MONTHS 2-3 DAYS PRIOR  TO FOLLOW UP WITH DR. HILTY  Follow-Up: At [facility], you and your health needs are our priority.  As part of our continuing mission to provide you with exceptional heart care, we have created designated Provider Care Teams.  These Care Teams include your primary Cardiologist (physician) and Advanced Practice Providers (APPs -  Physician Assistants and Nurse Practitioners) who all work together to provide you with the care you need, when you need it.  Your next appointment:    3 month(s)  The format for your next appointment:   In Person  Provider:   Kirtland Bouchard [place] Hilty, MD  Other Instructions      Signed, Azalee Course, PA  01/29/2021 12:06 AM    [facility]

## 2021-01-26 NOTE — Patient Instructions (Signed)
Medication Instructions:  Your physician recommends that you continue on your current medications as directed. Please refer to the Current Medication list given to you today.  *If you need a refill on your cardiac medications before your next appointment, please call your pharmacy*  Lab Work: Your physician recommends that you return for lab work in 6-8 WEEKS-03/09/21:   Fasting Lipid Panel-DO NOT EAT OR DRINK PAST MIDNIGHT. OKAY TO DRINK WATER.   Hepatic (Liver) Function  If you have labs (blood work) drawn today and your tests are completely normal, you will receive your results only by: Marland Kitchen MyChart Message (if you have MyChart) OR . A paper copy in the mail If you have any lab test that is abnormal or we need to change your treatment, we will call you to review the results.  Testing/Procedures: Your physician has requested that you have an echocardiogram. Echocardiography is a painless test that uses sound waves to create images of your heart. It provides your doctor with information about the size and shape of your heart and how well your heart's chambers and valves are working. This procedure takes approximately one hour. There are no restrictions for this procedure. THIS TEST IS PERFORMED AT [address], [place], [place] [address]   PLEASE SCHEDULE FOR 3 MONTHS 2-3 DAYS PRIOR  TO FOLLOW UP WITH DR. HILTY  Follow-Up: At [facility], you and your health needs are our priority.  As part of our continuing mission to provide you with exceptional heart care, we have created designated Provider Care Teams.  These Care Teams include your primary Cardiologist (physician) and Advanced Practice Providers (APPs -  Physician Assistants and Nurse Practitioners) who all work together to provide you with the care you need, when you need it.  Your next appointment:    3 month(s)  The format for your next appointment:   In Person  Provider:   K. [place] Hilty, MD  Other  Instructions

## 2021-01-27 DIAGNOSIS — I10 Essential (primary) hypertension: Secondary | ICD-10-CM | POA: Diagnosis not present

## 2021-01-27 DIAGNOSIS — G4733 Obstructive sleep apnea (adult) (pediatric): Secondary | ICD-10-CM | POA: Diagnosis not present

## 2021-01-27 DIAGNOSIS — E039 Hypothyroidism, unspecified: Secondary | ICD-10-CM | POA: Diagnosis not present

## 2021-01-29 ENCOUNTER — Other Ambulatory Visit: Payer: Self-pay

## 2021-01-29 ENCOUNTER — Ambulatory Visit: Payer: Self-pay

## 2021-01-29 ENCOUNTER — Ambulatory Visit
Admission: RE | Admit: 2021-01-29 | Discharge: 2021-01-29 | Disposition: A | Discharge status: Home / Self Care | Payer: Medicare PPO | Source: Ambulatory Visit | Attending: Family Medicine | Admitting: Family Medicine

## 2021-01-29 ENCOUNTER — Encounter: Payer: Self-pay | Admitting: Physician Assistant

## 2021-01-29 DIAGNOSIS — Z1231 Encounter for screening mammogram for malignant neoplasm of breast: Secondary | ICD-10-CM | POA: Diagnosis not present

## 2021-01-29 IMAGING — MG MM DIGITAL SCREENING BILAT W/ TOMO AND CAD
8 series · 8 of 24 positions shown · non-contrast
Comparison: Previous exam(s).

CLINICAL DATA: Screening.

EXAM:
DIGITAL SCREENING BILATERAL MAMMOGRAM WITH TOMOSYNTHESIS AND CAD
TECHNIQUE: Bilateral screening digital craniocaudal and mediolateral oblique
mammograms were obtained. Bilateral screening digital breast
tomosynthesis was performed. The images were evaluated with
computer-aided detection.

[R MLO synth-2D]
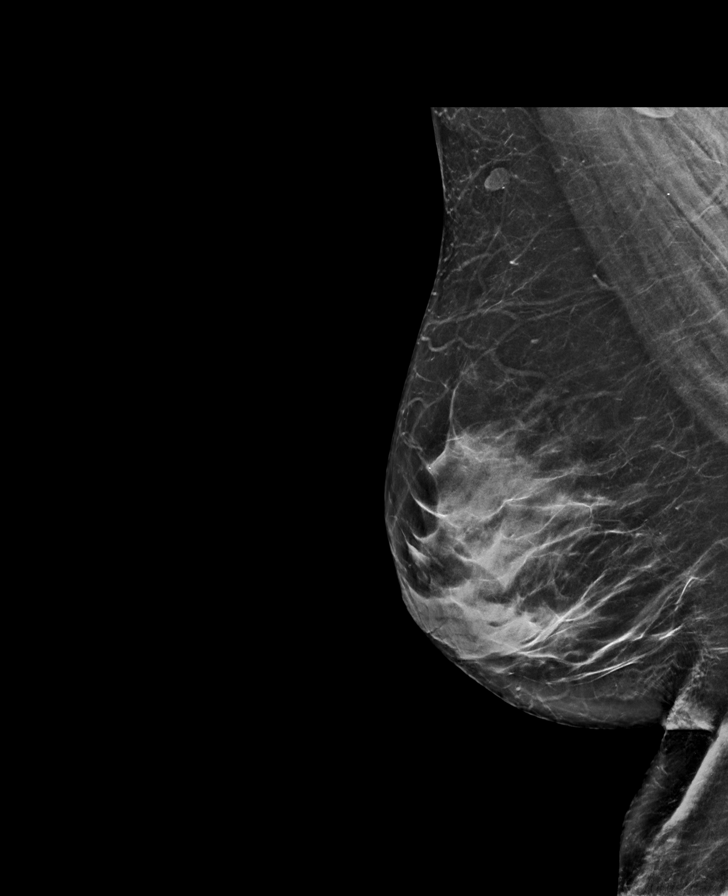

[L MLO synth-2D]
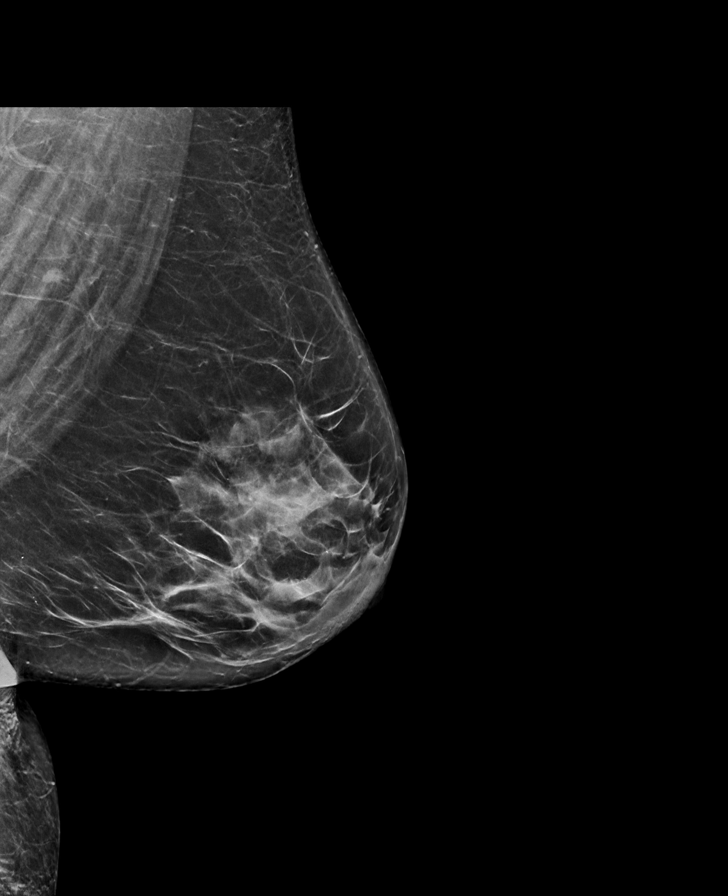

[L CC synth-2D]
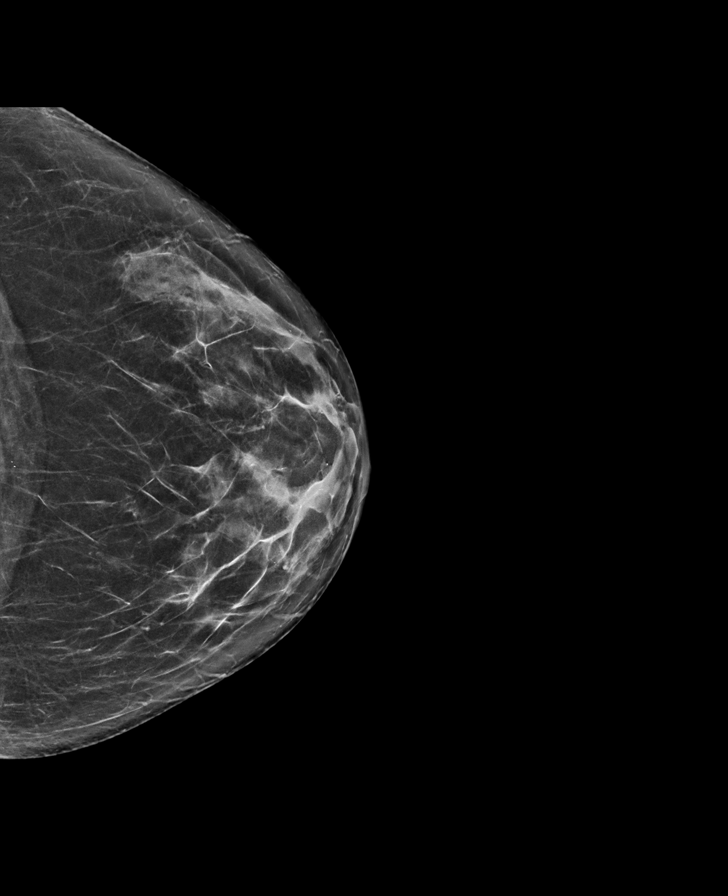

[R CC synth-2D]
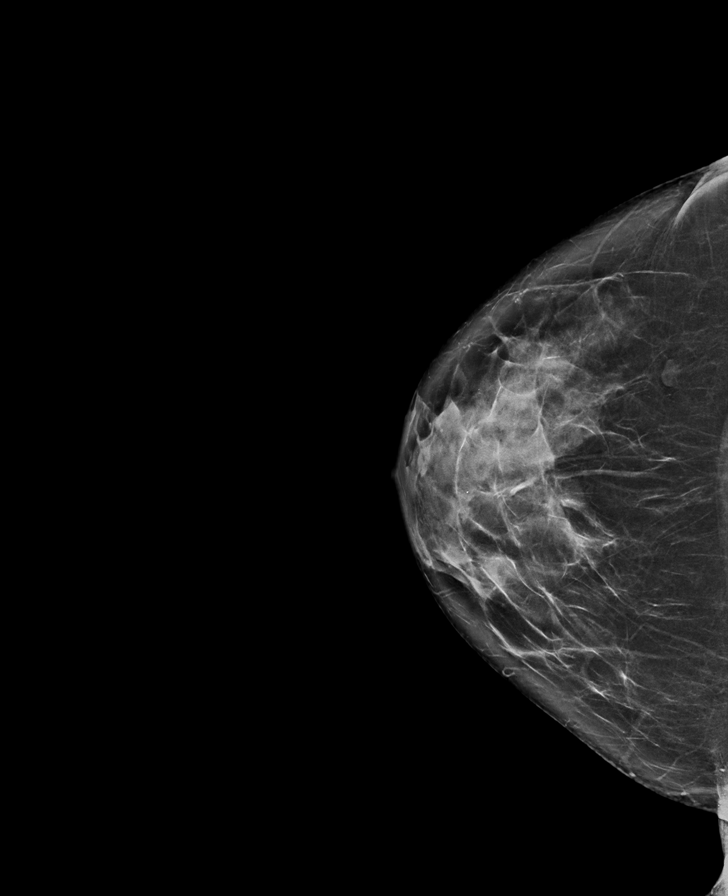

[R CC tomo · tomo slice 37/72.0]
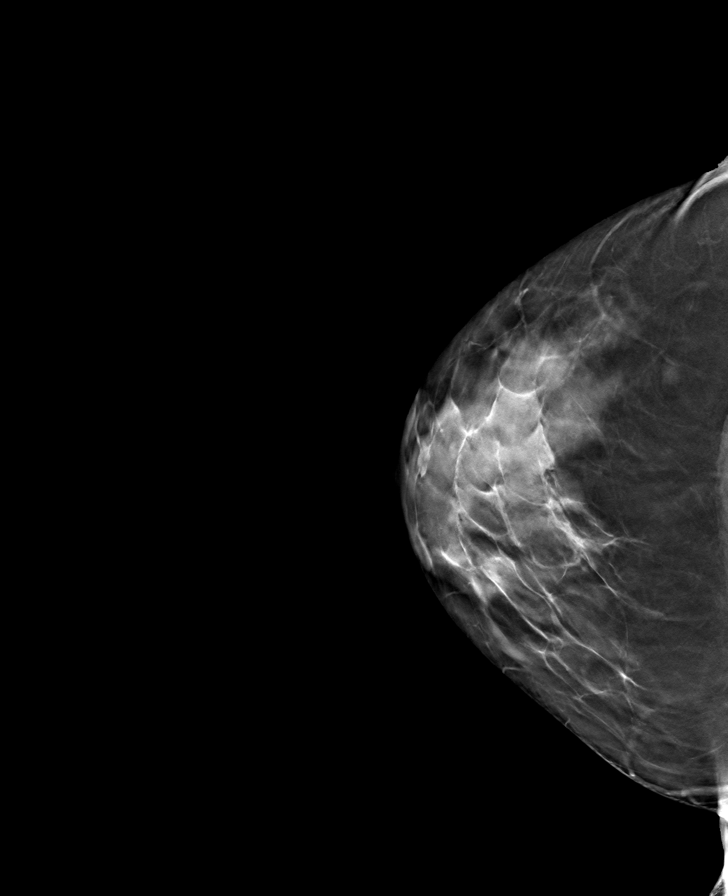

[L CC tomo · tomo slice 34/67.0]
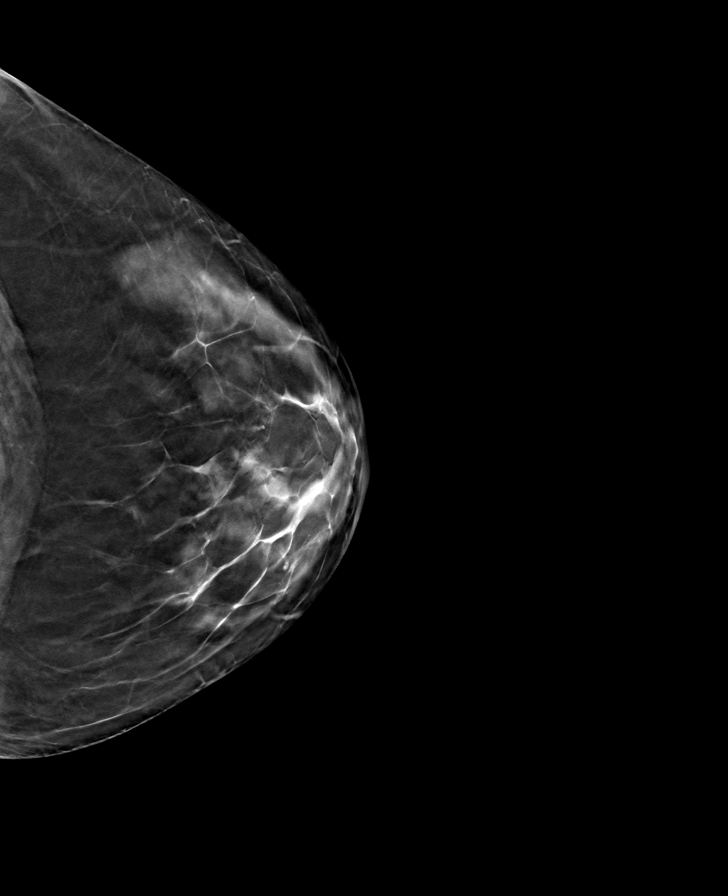

[L MLO tomo · tomo slice 37/74.0]
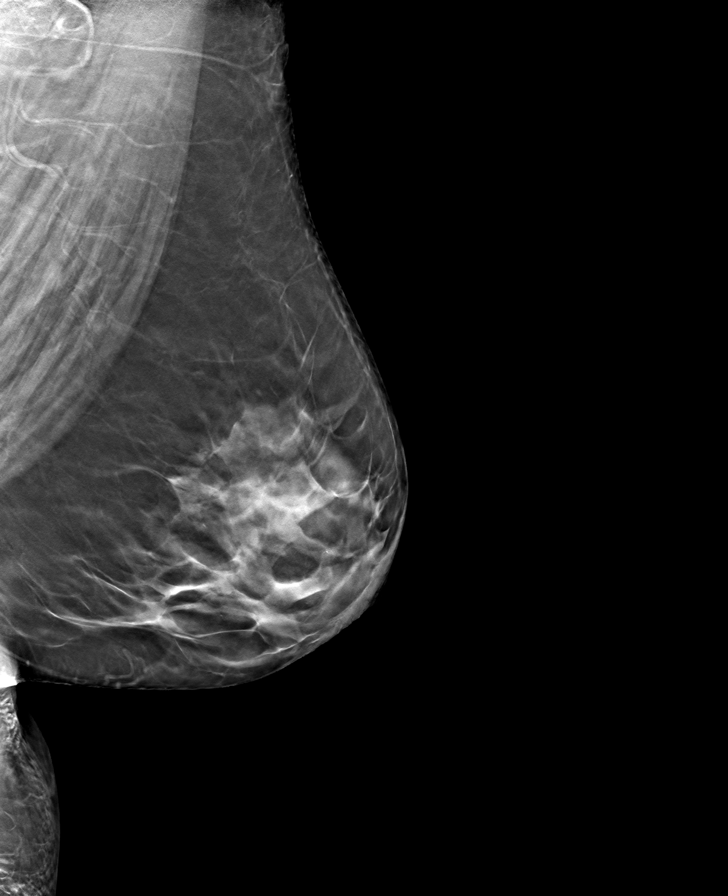

[R MLO tomo · tomo slice 38/75.0]
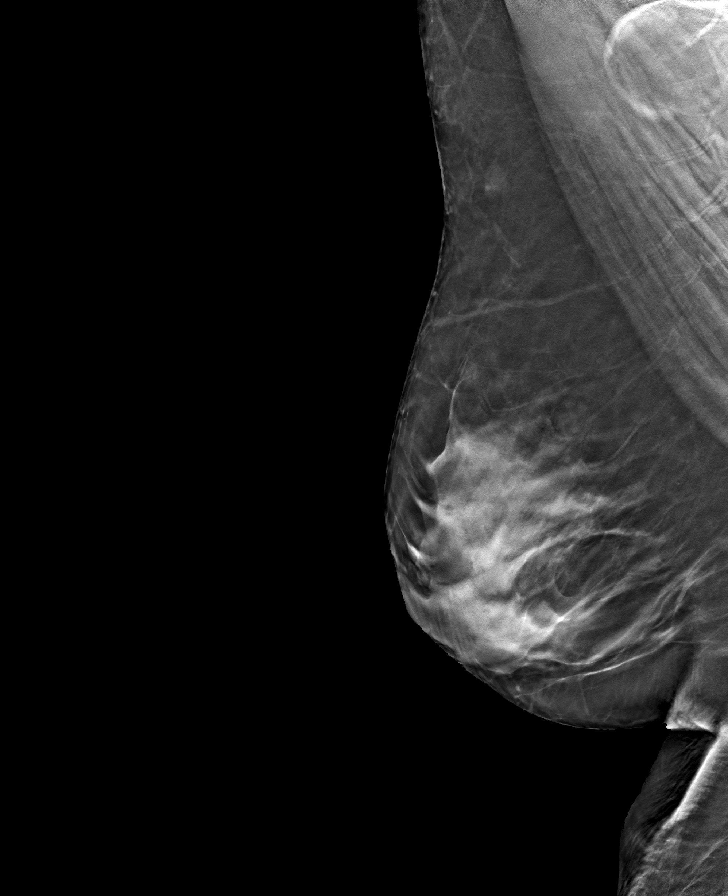

[8 of 24 positions shown; findings below may reference images not displayed]

ACR Breast Density Category c: The breast tissue is heterogeneously
dense, which may obscure small masses.
FINDINGS: There are no findings suspicious for malignancy. The images were
evaluated with computer-aided detection.
IMPRESSION: No mammographic evidence of malignancy. A result letter of this
screening mammogram will be mailed directly to the patient.

RECOMMENDATION:
Screening mammogram in one year. (Code:[6J])

BI-RADS CATEGORY  1: Negative.

## 2021-02-27 ENCOUNTER — Telehealth (HOSPITAL_COMMUNITY): Payer: Self-pay

## 2021-02-27 ENCOUNTER — Encounter (HOSPITAL_COMMUNITY): Payer: Self-pay

## 2021-02-27 NOTE — Telephone Encounter (Signed)
Attempted to call patient in regards to Cardiac Rehab - LM on VM Mailed letter 

## 2021-03-05 DIAGNOSIS — Z96653 Presence of artificial knee joint, bilateral: Secondary | ICD-10-CM | POA: Diagnosis not present

## 2021-03-05 DIAGNOSIS — Z96652 Presence of left artificial knee joint: Secondary | ICD-10-CM | POA: Diagnosis not present

## 2021-03-05 NOTE — Telephone Encounter (Signed)
Pt returned CR phone call and stated she is not interested in CR at this time, she is working out at home.   Closed referral

## 2021-03-06 ENCOUNTER — Telehealth (HOSPITAL_COMMUNITY): Payer: Self-pay

## 2021-03-06 NOTE — Telephone Encounter (Signed)
Pt called and stated, "she made a mistake, and would like to schedule for CR." Pt will come in for orientation on 03/31/21 @ 10:30AM and will attend the 1:30PM class.   Mailed letter

## 2021-03-09 ENCOUNTER — Telehealth (HOSPITAL_COMMUNITY): Payer: Self-pay

## 2021-03-09 NOTE — Telephone Encounter (Signed)
Was able to get pt in early for cardiac rehab. Pt will come in for orientation on 03/10/2021@2 :15 and start the 10:45am exercise class.

## 2021-03-10 ENCOUNTER — Encounter (HOSPITAL_COMMUNITY): Payer: Self-pay

## 2021-03-10 ENCOUNTER — Other Ambulatory Visit: Payer: Self-pay

## 2021-03-10 ENCOUNTER — Encounter (HOSPITAL_COMMUNITY)
Admission: RE | Admit: 2021-03-10 | Discharge: 2021-03-10 | Disposition: A | Discharge status: Home / Self Care | Payer: Medicare PPO | Source: Ambulatory Visit | Attending: Internal Medicine | Admitting: Internal Medicine

## 2021-03-10 VITALS — BP 114/68 | HR 76 | Ht 64.25 in | Wt 146.6 lb

## 2021-03-10 DIAGNOSIS — I214 Non-ST elevation (NSTEMI) myocardial infarction: Secondary | ICD-10-CM | POA: Insufficient documentation

## 2021-03-10 DIAGNOSIS — Z955 Presence of coronary angioplasty implant and graft: Secondary | ICD-10-CM | POA: Insufficient documentation

## 2021-03-10 NOTE — Progress Notes (Signed)
Cardiac Rehab Medication Review by a Nurse  Does the patient  feel that his/her medications are working for him/her?  Yes  Has the patient been experiencing any side effects to the medications prescribed?  No  Does the patient measure his/her own blood pressure or blood glucose at home?  Yes  Does the patient have any problems obtaining medications due to transportation or finances?  No  Understanding of regimen: excellent Understanding of indications: excellent Potential of compliance: excellent    Nurse  comments: Debbie Frazier is staking her medications as prescribed and has a good understanding of what they are for    Thayer Headings RN 03/10/2021 3:56 PM

## 2021-03-10 NOTE — Progress Notes (Signed)
Cardiac Individual Treatment Plan  Patient Details  Name: Debbie Frazier MRN: [id number] Date of Birth: Oct 23, 1955 Referring Provider:   Flowsheet Row CARDIAC REHAB PHASE II ORIENTATION from 03/10/2021 in [facility]  Referring Provider Debara Pickett, Nadean Corwin, MD       Initial Encounter Date:  Flowsheet Row CARDIAC REHAB PHASE II ORIENTATION from 03/10/2021 in [facility]  Date 03/10/21       Visit Diagnosis: 01/08/21 NSTEMI  01/09/21 DES LAD  Patient's Home Medications on Admission:  Current Outpatient Medications:    acyclovir (ZOVIRAX) 200 MG capsule, Take 20 mg by mouth as needed (flare)., Disp: , Rfl:    aspirin EC 81 MG EC tablet, Take 1 tablet (81 mg total) by mouth daily. Swallow whole., Disp: 30 tablet, Rfl: 11   atorvastatin (LIPITOR) 80 MG tablet, Take 1 tablet (80 mg total) by mouth daily., Disp: 30 tablet, Rfl: 11   carvedilol (COREG) 3.125 MG tablet, Take 1 tablet (3.125 mg total) by mouth 2 (two) times daily with a meal., Disp: 60 tablet, Rfl: 11   clobetasol (TEMOVATE) 0.05 % external solution, Apply 1 application topically as needed (scalp)., Disp: , Rfl:    fluorometholone (FML) 0.1 % ophthalmic suspension, Place 1 drop into both eyes as needed (irritation)., Disp: , Rfl:    hydrOXYzine (ATARAX/VISTARIL) 25 MG tablet, Take 25 mg by mouth as needed for anxiety., Disp: , Rfl:    losartan (COZAAR) 25 MG tablet, Take 1 tablet (25 mg total) by mouth daily., Disp: 30 tablet, Rfl: 11   nitroGLYCERIN (NITROSTAT) 0.4 MG SL tablet, Place 1 tablet (0.4 mg total) under the tongue every 5 (five) minutes x 3 doses as needed for chest pain., Disp: 25 tablet, Rfl: 11   sertraline (ZOLOFT) 100 MG tablet, Take 100 mg by mouth daily. Take 1.5 Tablets at Bedtime, Disp: , Rfl:    SYNTHROID 150 MCG tablet, Take 150 mcg by mouth at bedtime., Disp: , Rfl:    ticagrelor (BRILINTA) 90 MG TABS tablet, Take 1 tablet (90 mg total) by  mouth 2 (two) times daily., Disp: 180 tablet, Rfl: 3   timolol (TIMOPTIC) 0.5 % ophthalmic solution, Place 1 drop into the left eye 2 (two) times daily., Disp: , Rfl:   Past Medical History: Past Medical History:  Diagnosis Date   Coronary Artery Disease 01/10/2021   NSTEMI 12/2020 - Cath: pLAD 75, mLAD 99 >> s/p DES; oLCx 40   Depression    HFrEF (heart failure with reduced ejection fraction)    Ischemic CM // Echo 4/22: apical / apical ant / apical septal AK; EF 35-40, mild asymmetric LVH, Gr 2 DD, normal RVSF, mild LAE, trivial MR, mild AI   High cholesterol    Hypothyroidism    Non-ST elevated myocardial infarction 01/08/2021   12/2020 - S/p 3 x 28 mm Synergy DES to prox-mid LAD   OSA on CPAP     Tobacco Use: Social History   Tobacco Use  Smoking Status Never  Smokeless Tobacco Never    Labs: Recent Review Flowsheet Data     Labs for ITP Cardiac and Pulmonary Rehab Latest Ref Rng & Units 01/09/2021   Cholestrol 0 - 200 mg/dL 170   LDLCALC 0 - 99 mg/dL 91   HDL >40 mg/dL 45   Trlycerides <150 mg/dL 171(H)       Capillary Blood Glucose: No results found for: GLUCAP   Exercise Target Goals: Exercise Program Goal: Individual  exercise prescription set using results from initial 6 min walk test and THRR while considering  patient's activity barriers and safety.   Exercise Prescription Goal: Starting with aerobic activity 30 plus minutes a day, 3 days per week for initial exercise prescription. Provide home exercise prescription and guidelines that participant acknowledges understanding prior to discharge.  Activity Barriers & Risk Stratification:  Activity Barriers & Cardiac Risk Stratification - 03/10/21 1442       Activity Barriers & Cardiac Risk Stratification   Activity Barriers Left Knee Replacement;Right Knee Replacement;History of Falls;Balance Concerns;Other (comment)    Comments Vestibular disorder, occassionally effects balance. Glaucoma, occassionally  effects depth perception going up and down stairs.    Cardiac Risk Stratification High             6 Minute Walk:  6 Minute Walk     Row Name 03/10/21 1502         6 Minute Walk   Phase Initial     Distance 1613 feet     Walk Time 6 minutes     # of Rest Breaks 0     MPH 3.05     METS 3.67     RPE 12     Perceived Dyspnea  1     VO2 Peak 12.86     Symptoms Yes (comment)     Comments Mild SOB     Resting HR 76 bpm     Resting BP 114/68     Resting Oxygen Saturation  99 %     Exercise Oxygen Saturation  during 6 min walk 96 %     Max Ex. HR 101 bpm     Max Ex. BP 120/70     2 Minute Post BP 128/62              Oxygen Initial Assessment:   Oxygen Re-Evaluation:   Oxygen Discharge (Final Oxygen Re-Evaluation):   Initial Exercise Prescription:  Initial Exercise Prescription - 03/10/21 1500       Date of Initial Exercise RX and Referring Provider   Date 03/10/21    Referring Provider Pixie Casino, MD    Expected Discharge Date 05/08/21      Treadmill   MPH 3    Grade 0    Minutes 15    METs 3.3      Recumbant Bike   Level 3    Watts 35    Minutes 15    METs 3.66      Prescription Details   Frequency (times per week) 3    Duration Progress to 30 minutes of continuous aerobic without signs/symptoms of physical distress      Intensity   THRR 40-80% of Max Heartrate 62-123    Ratings of Perceived Exertion 11-13    Perceived Dyspnea 0-4      Progression   Progression Continue to progress workloads to maintain intensity without signs/symptoms of physical distress.      Resistance Training   Training Prescription Yes    Weight 3 lbs    Reps 10-15             Perform Capillary Blood Glucose checks as needed.  Exercise Prescription Changes:   Exercise Comments:   Exercise Goals and Review:   Exercise Goals     Row Name 03/10/21 1422             Exercise Goals   Increase Physical Activity Yes       Intervention  Provide advice, education, support and counseling about physical activity/exercise needs.;Develop an individualized exercise prescription for aerobic and resistive training based on initial evaluation findings, risk stratification, comorbidities and participant's personal goals.       Expected Outcomes Short Term: Attend rehab on a regular basis to increase amount of physical activity.;Long Term: Exercising regularly at least 3-5 days a week.;Long Term: Add in home exercise to make exercise part of routine and to increase amount of physical activity.       Increase Strength and Stamina Yes       Intervention Provide advice, education, support and counseling about physical activity/exercise needs.;Develop an individualized exercise prescription for aerobic and resistive training based on initial evaluation findings, risk stratification, comorbidities and participant's personal goals.       Expected Outcomes Short Term: Increase workloads from initial exercise prescription for resistance, speed, and METs.;Short Term: Perform resistance training exercises routinely during rehab and add in resistance training at home;Long Term: Improve cardiorespiratory fitness, muscular endurance and strength as measured by increased METs and functional capacity (6MWT)       Able to understand and use rate of perceived exertion (RPE) scale Yes       Intervention Provide education and explanation on how to use RPE scale       Expected Outcomes Short Term: Able to use RPE daily in rehab to express subjective intensity level;Long Term:  Able to use RPE to guide intensity level when exercising independently       Knowledge and understanding of Target Heart Rate Range (THRR) Yes       Intervention Provide education and explanation of THRR including how the numbers were predicted and where they are located for reference       Expected Outcomes Short Term: Able to state/look up THRR;Long Term: Able to use THRR to govern intensity  when exercising independently;Short Term: Able to use daily as guideline for intensity in rehab       Able to check pulse independently Yes       Intervention Provide education and demonstration on how to check pulse in carotid and radial arteries.;Review the importance of being able to check your own pulse for safety during independent exercise       Expected Outcomes Short Term: Able to explain why pulse checking is important during independent exercise;Long Term: Able to check pulse independently and accurately       Understanding of Exercise Prescription Yes       Intervention Provide education, explanation, and written materials on patient's individual exercise prescription       Expected Outcomes Short Term: Able to explain program exercise prescription;Long Term: Able to explain home exercise prescription to exercise independently                Exercise Goals Re-Evaluation :    Discharge Exercise Prescription (Final Exercise Prescription Changes):   Nutrition:  Target Goals: Understanding of nutrition guidelines, daily intake of sodium <1531m, cholesterol <2061m calories 30% from fat and 7% or less from saturated fats, daily to have 5 or more servings of fruits and vegetables.  Biometrics:  Pre Biometrics - 03/10/21 1421       Pre Biometrics   Waist Circumference 37.5 inches    Hip Circumference 39.75 inches    Waist to Hip Ratio 0.94 %    Triceps Skinfold 12 mm    % Body Fat 33.9 %    Grip Strength 28.5 kg    Flexibility 19.75 in  Single Leg Stand 2.68 seconds              Nutrition Therapy Plan and Nutrition Goals:   Nutrition Assessments:  MEDIFICTS Score Key: ?70 Need to make dietary changes  40-70 Heart Healthy Diet ? 40 Therapeutic Level Cholesterol Diet   Picture Your Plate Scores: <63 Unhealthy dietary pattern with much room for improvement. 41-50 Dietary pattern unlikely to meet recommendations for good health and room for  improvement. 51-60 More healthful dietary pattern, with some room for improvement.  >60 Healthy dietary pattern, although there may be some specific behaviors that could be improved.    Nutrition Goals Re-Evaluation:   Nutrition Goals Discharge (Final Nutrition Goals Re-Evaluation):   Psychosocial: Target Goals: Acknowledge presence or absence of significant depression and/or stress, maximize coping skills, provide positive support system. Participant is able to verbalize types and ability to use techniques and skills needed for reducing stress and depression.  Initial Review & Psychosocial Screening:  Initial Psych Review & Screening - 03/10/21 1559       Initial Review   Current issues with History of Depression      Family Dynamics   Good Support System? Yes   Kassidee lives alone. Isabeau has her daughter who lives in town and her elderly mother for support     Barriers   Psychosocial barriers to participate in program There are no identifiable barriers or psychosocial needs.      Screening Interventions   Interventions Encouraged to exercise             Quality of Life Scores:  Quality of Life - 03/10/21 1552       Quality of Life   Select Quality of Life      Quality of Life Scores   Health/Function Pre 23.89 %    Socioeconomic Pre 26.79 %    Psych/Spiritual Pre 24.86 %    Family Pre 30 %    GLOBAL Pre 25.5 %            Scores of 19 and below usually indicate a poorer quality of life in these areas.  A difference of  2-3 points is a clinically meaningful difference.  A difference of 2-3 points in the total score of the Quality of Life Index has been associated with significant improvement in overall quality of life, self-image, physical symptoms, and general health in studies assessing change in quality of life.  PHQ-9: Recent Review Flowsheet Data     Depression screen [facility] 2/9 03/10/2021   Decreased Interest 0   Down, Depressed, Hopeless 0   PHQ - 2  Score 0      Interpretation of Total Score  Total Score Depression Severity:  1-4 = Minimal depression, 5-9 = Mild depression, 10-14 = Moderate depression, 15-19 = Moderately severe depression, 20-27 = Severe depression   Psychosocial Evaluation and Intervention:   Psychosocial Re-Evaluation:   Psychosocial Discharge (Final Psychosocial Re-Evaluation):   Vocational Rehabilitation: Provide vocational rehab assistance to qualifying candidates.   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 Participant will state understanding/return demonstration of topics presented.  Learning Barriers/Preferences:  Learning Barriers/Preferences - 03/10/21 1600       Learning Barriers/Preferences   Learning Barriers Sight   Has Glaucoma   Learning Preferences Skilled Demonstration;Video;Pictoral             Education Topics: Hypertension, Hypertension Reduction -Define heart disease and high blood pressure. Discus how high blood pressure affects the body and ways to reduce high blood pressure.   Exercise and Your Heart -Discuss why it is important to exercise, the FITT principles of exercise, normal and abnormal responses to exercise, and how to exercise safely.   Angina -Discuss definition of angina, causes of angina, treatment of angina, and how to decrease risk of having angina.   Cardiac Medications -Review what the following cardiac medications are used for, how they affect the body, and side effects that may occur when taking the medications.  Medications include Aspirin, Beta blockers, calcium channel blockers, ACE Inhibitors,  angiotensin receptor blockers, diuretics, digoxin, and antihyperlipidemics.   Congestive Heart Failure -Discuss the definition of CHF, how to live with CHF, the signs and symptoms of CHF, and how keep track of weight and sodium intake.   Heart Disease and Intimacy -Discus the effect sexual activity has on the heart, how changes occur during intimacy as we age, and safety during sexual activity.   Smoking Cessation / COPD -Discuss different methods to quit smoking, the health benefits of quitting smoking, and the definition of COPD.   Nutrition I: Fats -Discuss the types of cholesterol, what cholesterol does to the heart, and how cholesterol levels can be controlled.   Nutrition II: Labels -Discuss the different components of food labels and how to read food label   Heart Parts/Heart Disease and PAD -Discuss the anatomy of the heart, the pathway of blood circulation through the heart, and these are affected by heart disease.   Stress I: Signs and Symptoms -Discuss the causes of stress, how stress may lead to anxiety and depression, and ways to limit stress.   Stress II: Relaxation -Discuss different types of relaxation techniques to limit stress.   Warning Signs of Stroke / TIA -Discuss definition of a stroke, what the signs and symptoms are of a stroke, and how to identify when someone is having stroke.   Knowledge Questionnaire Score:  Knowledge Questionnaire Score - 03/10/21 1552       Knowledge Questionnaire Score   Pre Score 24/24             Core Components/Risk Factors/Patient Goals at Admission:  Personal Goals and Risk Factors at Admission - 03/10/21 1442       Core Components/Risk Factors/Patient Goals on Admission    Weight Management Yes;Weight Maintenance    Intervention Weight Management: Provide education and appropriate resources to help participant work on and attain dietary goals.;Weight Management: Develop a combined nutrition and exercise  program designed to reach desired caloric intake, while maintaining appropriate intake of nutrient and fiber, sodium and fats, and appropriate energy expenditure required for the weight goal.    Expected Outcomes Understanding of distribution of calorie intake throughout the day with the consumption of 4-5 meals/snacks;Understanding recommendations for meals to include 15-35% energy as protein, 25-35% energy from fat, 35-60% energy from carbohydrates, less than 25m of dietary cholesterol, 20-35 gm of total fiber daily;Weight Maintenance: Understanding of the daily nutrition guidelines, which includes 25-35% calories from fat, 7% or less cal from saturated fats, less than 2093mcholesterol, less than 1.5gm of sodium, & 5 or more servings of  fruits and vegetables daily    Heart Failure Yes    Intervention Provide a combined exercise and nutrition program that is supplemented with education, support and counseling about heart failure. Directed toward relieving symptoms such as shortness of breath, decreased exercise tolerance, and extremity edema.    Expected Outcomes Improve functional capacity of life;Short term: Attendance in program 2-3 days a week with increased exercise capacity. Reported lower sodium intake. Reported increased fruit and vegetable intake. Reports medication compliance.;Short term: Daily weights obtained and reported for increase. Utilizing diuretic protocols set by physician.;Long term: Adoption of self-care skills and reduction of barriers for early signs and symptoms recognition and intervention leading to self-care maintenance.    Lipids Yes    Intervention Provide education and support for participant on nutrition & aerobic/resistive exercise along with prescribed medications to achieve LDL <29m, HDL >472m    Expected Outcomes Short Term: Participant states understanding of desired cholesterol values and is compliant with medications prescribed. Participant is following exercise  prescription and nutrition guidelines.;Long Term: Cholesterol controlled with medications as prescribed, with individualized exercise RX and with personalized nutrition plan. Value goals: LDL < 7037mHDL > 40 mg.    Stress Yes    Intervention Offer individual and/or small group education and counseling on adjustment to heart disease, stress management and health-related lifestyle change. Teach and support self-help strategies.;Refer participants experiencing significant psychosocial distress to appropriate mental health specialists for further evaluation and treatment. When possible, include family members and significant others in education/counseling sessions.    Expected Outcomes Short Term: Participant demonstrates changes in health-related behavior, relaxation and other stress management skills, ability to obtain effective social support, and compliance with psychotropic medications if prescribed.;Long Term: Emotional wellbeing is indicated by absence of clinically significant psychosocial distress or social isolation.    Personal Goal Other Yes    Personal Goal Improve ejection fraction/ improve heart fucntion so that she can do ADLs, be able to drink more water, be able to discontinue some medications with physician approval.    Intervention Provide a exercise routine with aerobic training, resistance training, and stretching exercises to help build cardiorespiratory fitness and improve heart function.    Expected Outcomes Adhere to consistent exercise routine to help build strength and stamina and improve functional capacity as measured by MET level and 6 MWT.             Core Components/Risk Factors/Patient Goals Review:    Core Components/Risk Factors/Patient Goals at Discharge (Final Review):    ITP Comments:  ITP Comments     Row Name 03/10/21 1421           ITP Comments Medical Director- Dr. TraFransico HimD                Comments:Magenta attended orientation on  03/10/2021 to review rules and guidelines for program.  Completed 6 minute walk test, Intitial ITP, and exercise prescription.  VSS. Telemetry-Sinus Rhythm.  LauDallyported mild shortness of breath which resolved with rest otherwise. Asymptomatic. Safety measures and social distancing in place per CDC guidelines. MarBarnet PallN,BSN 03/10/2021 4:16 PM

## 2021-03-18 ENCOUNTER — Other Ambulatory Visit: Payer: Self-pay

## 2021-03-18 ENCOUNTER — Encounter (HOSPITAL_COMMUNITY)
Admission: RE | Admit: 2021-03-18 | Discharge: 2021-03-18 | Disposition: A | Discharge status: Home / Self Care | Payer: Medicare PPO | Source: Ambulatory Visit | Attending: Internal Medicine | Admitting: Internal Medicine

## 2021-03-18 DIAGNOSIS — Z955 Presence of coronary angioplasty implant and graft: Secondary | ICD-10-CM | POA: Insufficient documentation

## 2021-03-18 DIAGNOSIS — I252 Old myocardial infarction: Secondary | ICD-10-CM | POA: Diagnosis not present

## 2021-03-18 DIAGNOSIS — I214 Non-ST elevation (NSTEMI) myocardial infarction: Secondary | ICD-10-CM

## 2021-03-18 DIAGNOSIS — Z48812 Encounter for surgical aftercare following surgery on the circulatory system: Secondary | ICD-10-CM | POA: Insufficient documentation

## 2021-03-18 NOTE — Progress Notes (Signed)
Daily Session Note  Patient Details  Name: Debbie Frazier MRN: [id number] Date of Birth: 04/19/1956 Referring Provider:   Flowsheet Row CARDIAC REHAB PHASE II ORIENTATION from 03/10/2021 in [facility]  Referring Provider Pixie Casino, MD       Encounter Date: 03/18/2021  Check In:  Session Check In - 03/18/21 1047       Check-In   Supervising physician immediately available to respond to emergencies [facility] immediately available    Physician(s) Dr. Cruzita Lederer    Location [facility]    Staff Present Esmeralda Links BS, ACSM EP-C, Exercise Physiologist;David Dellrose, MS, ACSM-CEP, CCRP, Exercise Physiologist;Lisa Ysidro Evert, RN;Jonathan [place] Aver, MS, ACSM CEP, Exercise Physiologist;Maria Whitaker, RN, BSN    Virtual Visit No    Medication changes reported     No    Fall or balance concerns reported    No    Tobacco Cessation No Change    Warm-up and Cool-down Performed on first and last piece of equipment    Resistance Training Performed No    VAD Patient? No    PAD/SET Patient? No      Pain Assessment   Currently in Pain? No/denies    Pain Score 0-No pain    Multiple Pain Sites No             Capillary Blood Glucose: No results found for this or any previous visit (from the past 24 hour(s)).   Exercise Prescription Changes - 03/18/21 1049       Response to Exercise   Blood Pressure (Admit) 102/58    Blood Pressure (Exercise) 128/66    Blood Pressure (Exit) 98/61    Heart Rate (Admit) 67 bpm    Heart Rate (Exercise) 85 bpm    Heart Rate (Exit) 67 bpm    Rating of Perceived Exertion (Exercise) 13    Symptoms None    Comments Off to a good start with exercise.    Duration Continue with 30 min of aerobic exercise without signs/symptoms of physical distress.    Intensity THRR unchanged      Progression   Progression Continue to progress workloads to maintain intensity without signs/symptoms of physical  distress.    Average METs 3.5      Resistance Training   Training Prescription No      Interval Training   Interval Training No      Treadmill   MPH 2.5    Grade 0    Minutes 15    METs 2.91      Recumbant Bike   Level 3    Minutes 15    METs 4.2             Social History   Tobacco Use  Smoking Status Never  Smokeless Tobacco Never    Goals Met:  Independence with exercise equipment Exercise tolerated well  Goals Unmet:  Not Applicable  Comments: Patient started cardiac rehab today. Patient oriented to exercise equipment and routine, understanding verbalized. Patient tolerated low to moderate intensity exercise well without difficulty. Vital signs stable during exercise, normal sinus rhythm on telemetry. Blood pressure low after exercise, asymptomatic.   Dr. Fransico Him is Medical Director for Cardiac Rehab at [facility].

## 2021-03-19 ENCOUNTER — Other Ambulatory Visit (HOSPITAL_BASED_OUTPATIENT_CLINIC_OR_DEPARTMENT_OTHER): Payer: Self-pay

## 2021-03-19 DIAGNOSIS — G4733 Obstructive sleep apnea (adult) (pediatric): Secondary | ICD-10-CM

## 2021-03-20 ENCOUNTER — Encounter (HOSPITAL_COMMUNITY)
Admission: RE | Admit: 2021-03-20 | Discharge: 2021-03-20 | Disposition: A | Discharge status: Home / Self Care | Payer: Medicare PPO | Source: Ambulatory Visit | Attending: Internal Medicine | Admitting: Internal Medicine

## 2021-03-20 ENCOUNTER — Other Ambulatory Visit: Payer: Self-pay

## 2021-03-20 DIAGNOSIS — Z955 Presence of coronary angioplasty implant and graft: Secondary | ICD-10-CM | POA: Diagnosis not present

## 2021-03-20 DIAGNOSIS — I252 Old myocardial infarction: Secondary | ICD-10-CM | POA: Diagnosis not present

## 2021-03-20 DIAGNOSIS — I214 Non-ST elevation (NSTEMI) myocardial infarction: Secondary | ICD-10-CM

## 2021-03-20 DIAGNOSIS — Z48812 Encounter for surgical aftercare following surgery on the circulatory system: Secondary | ICD-10-CM | POA: Diagnosis not present

## 2021-03-20 NOTE — Progress Notes (Signed)
Debbie Frazier 65 y.o. female Nutrition Note  Diagnosis:  Past Medical History:  Diagnosis Date   Coronary Artery Disease 01/10/2021   NSTEMI 12/2020 - Cath: pLAD 75, mLAD 99 >> s/p DES; oLCx 40   Depression    HFrEF (heart failure with reduced ejection fraction)    Ischemic CM // Echo 4/22: apical / apical ant / apical septal AK; EF 35-40, mild asymmetric LVH, Gr 2 DD, normal RVSF, mild LAE, trivial MR, mild AI   High cholesterol    Hypothyroidism    Non-ST elevated myocardial infarction 01/08/2021   12/2020 - S/p 3 x 28 mm Synergy DES to prox-mid LAD   OSA on CPAP      Medications reviewed.   Current Outpatient Medications:    acyclovir (ZOVIRAX) 200 MG capsule, Take 20 mg by mouth as needed (flare)., Disp: , Rfl:    aspirin EC 81 MG EC tablet, Take 1 tablet (81 mg total) by mouth daily. Swallow whole., Disp: 30 tablet, Rfl: 11   atorvastatin (LIPITOR) 80 MG tablet, Take 1 tablet (80 mg total) by mouth daily., Disp: 30 tablet, Rfl: 11   carvedilol (COREG) 3.125 MG tablet, Take 1 tablet (3.125 mg total) by mouth 2 (two) times daily with a meal., Disp: 60 tablet, Rfl: 11   clobetasol (TEMOVATE) 0.05 % external solution, Apply 1 application topically as needed (scalp)., Disp: , Rfl:    fluorometholone (FML) 0.1 % ophthalmic suspension, Place 1 drop into both eyes as needed (irritation)., Disp: , Rfl:    hydrOXYzine (ATARAX/VISTARIL) 25 MG tablet, Take 25 mg by mouth as needed for anxiety., Disp: , Rfl:    losartan (COZAAR) 25 MG tablet, Take 1 tablet (25 mg total) by mouth daily., Disp: 30 tablet, Rfl: 11   nitroGLYCERIN (NITROSTAT) 0.4 MG SL tablet, Place 1 tablet (0.4 mg total) under the tongue every 5 (five) minutes x 3 doses as needed for chest pain., Disp: 25 tablet, Rfl: 11   sertraline (ZOLOFT) 100 MG tablet, Take 100 mg by mouth daily. Take 1.5 Tablets at Bedtime, Disp: , Rfl:    SYNTHROID 150 MCG tablet, Take 150 mcg by mouth at bedtime., Disp: , Rfl:    ticagrelor  (BRILINTA) 90 MG TABS tablet, Take 1 tablet (90 mg total) by mouth 2 (two) times daily., Disp: 180 tablet, Rfl: 3   timolol (TIMOPTIC) 0.5 % ophthalmic solution, Place 1 drop into the left eye 2 (two) times daily., Disp: , Rfl:    Ht Readings from Last 1 Encounters:  03/10/21 5' 4.25" (1.632 m)     Wt Readings from Last 3 Encounters:  03/10/21 146 lb 9.7 oz (66.5 kg)  01/26/21 151 lb (68.5 kg)  01/10/21 151 lb (68.5 kg)     There is no height or weight on file to calculate BMI.   Social History   Tobacco Use  Smoking Status Never  Smokeless Tobacco Never     Lab Results  Component Value Date   CHOL 170 01/09/2021   Lab Results  Component Value Date   HDL 45 01/09/2021   Lab Results  Component Value Date   LDLCALC 91 01/09/2021   Lab Results  Component Value Date   TRIG 171 (H) 01/09/2021     No results found for: HGBA1C   CBG (last 3)  No results for input(s): GLUCAP in the last 72 hours.   Nutrition Note  Spoke with pt. Nutrition Plan and Nutrition Survey goals reviewed with pt. Pt is following  a Heart Healthy diet. Pt was eating a healthy diet prior to STEMI. She has been reducing sodium and alcohol since STEMI. She was drinking 10-12 alcholic beverages per week and now drinking 0. Pt with dx of CHF. Per discussion, pt does not use canned/convenience foods often. Pt does not add salt to food. Pt does not eat out frequently. Pt is also following a 2 L fluid restriction. She is avoiding alcohol completely. Pt does read labels. Noted elevated LDL and triglycerides. Pt has already reduced saturated fat intake. Provided pt with information on soluble fiber.  Pt expressed understanding of the information reviewed.   Nutrition Diagnosis Food-and nutrition-related knowledge deficit related to lack of exposure to information as related to diagnosis of: ? CVD ?   Nutrition Intervention Pt's individual nutrition plan reviewed with pt. Benefits of adopting Heart  Healthy diet discussed when Picture Your Plate reviewed.   Pt given handouts for: ? Nutrition I class ? Nutrition II class ?  Continue client-centered nutrition education by RD, as part of interdisciplinary care.  Goal(s) Pt to build a healthy plate including vegetables, fruits, whole grains, and low-fat dairy products in a heart healthy meal plan. Pt to read labels to continue eating a low sodium diet <1500 mg/day Pt to incorporate 10-20 g soluble fiber daily  Plan:   Will provide client-centered nutrition education as part of interdisciplinary care Monitor and evaluate progress toward nutrition goal with team.   Andrey Campanile, MS, RDN, LDN

## 2021-03-23 ENCOUNTER — Other Ambulatory Visit: Payer: Self-pay

## 2021-03-23 ENCOUNTER — Encounter (HOSPITAL_COMMUNITY)
Admission: RE | Admit: 2021-03-23 | Discharge: 2021-03-23 | Disposition: A | Discharge status: Home / Self Care | Payer: Medicare PPO | Source: Ambulatory Visit | Attending: Internal Medicine | Admitting: Internal Medicine

## 2021-03-23 VITALS — Wt 146.2 lb

## 2021-03-23 DIAGNOSIS — I214 Non-ST elevation (NSTEMI) myocardial infarction: Secondary | ICD-10-CM

## 2021-03-23 DIAGNOSIS — Z955 Presence of coronary angioplasty implant and graft: Secondary | ICD-10-CM | POA: Diagnosis not present

## 2021-03-23 DIAGNOSIS — Z48812 Encounter for surgical aftercare following surgery on the circulatory system: Secondary | ICD-10-CM | POA: Diagnosis not present

## 2021-03-23 DIAGNOSIS — I252 Old myocardial infarction: Secondary | ICD-10-CM | POA: Diagnosis not present

## 2021-03-24 DIAGNOSIS — E785 Hyperlipidemia, unspecified: Secondary | ICD-10-CM | POA: Diagnosis not present

## 2021-03-25 ENCOUNTER — Other Ambulatory Visit: Payer: Self-pay

## 2021-03-25 ENCOUNTER — Encounter (HOSPITAL_COMMUNITY)
Admission: RE | Admit: 2021-03-25 | Discharge: 2021-03-25 | Disposition: A | Discharge status: Home / Self Care | Payer: Medicare PPO | Source: Ambulatory Visit | Attending: Internal Medicine | Admitting: Internal Medicine

## 2021-03-25 DIAGNOSIS — I252 Old myocardial infarction: Secondary | ICD-10-CM | POA: Diagnosis not present

## 2021-03-25 DIAGNOSIS — Z48812 Encounter for surgical aftercare following surgery on the circulatory system: Secondary | ICD-10-CM | POA: Diagnosis not present

## 2021-03-25 DIAGNOSIS — Z955 Presence of coronary angioplasty implant and graft: Secondary | ICD-10-CM | POA: Diagnosis not present

## 2021-03-25 DIAGNOSIS — I214 Non-ST elevation (NSTEMI) myocardial infarction: Secondary | ICD-10-CM

## 2021-03-25 LAB — HEPATIC FUNCTION PANEL
ALT: 26 IU/L (ref 0–32)
AST: 29 IU/L (ref 0–40)
Albumin: 4.7 g/dL (ref 3.8–4.8)
Alkaline Phosphatase: 92 IU/L (ref 44–121)
Bilirubin Total: 0.4 mg/dL (ref 0.0–1.2)
Bilirubin, Direct: 0.14 mg/dL (ref 0.00–0.40)
Total Protein: 6.6 g/dL (ref 6.0–8.5)

## 2021-03-25 LAB — LIPID PANEL
Chol/HDL Ratio: 2.7 ratio (ref 0.0–4.4)
Cholesterol, Total: 103 mg/dL (ref 100–199)
HDL: 38 mg/dL — ABNORMAL LOW (ref >39)
LDL Chol Calc (NIH): 49 mg/dL (ref 0–99)
Triglycerides: 75 mg/dL (ref 0–149)
VLDL Cholesterol Cal: 16 mg/dL (ref 5–40)

## 2021-03-25 NOTE — Progress Notes (Signed)
Reviewed home exercise guidelines with patient including endpoints, temperature precautions, target heart rate and rate of perceived exertion. Patient is currently walking as her mode of home exercise. Patient voices understanding of instructions given.  Artist Pais, MS, ACSM CEP

## 2021-03-27 ENCOUNTER — Encounter (HOSPITAL_COMMUNITY)
Admission: RE | Admit: 2021-03-27 | Discharge: 2021-03-27 | Disposition: A | Discharge status: Home / Self Care | Payer: Medicare PPO | Source: Ambulatory Visit | Attending: Internal Medicine | Admitting: Internal Medicine

## 2021-03-27 ENCOUNTER — Other Ambulatory Visit: Payer: Self-pay

## 2021-03-27 DIAGNOSIS — I214 Non-ST elevation (NSTEMI) myocardial infarction: Secondary | ICD-10-CM

## 2021-03-27 DIAGNOSIS — Z955 Presence of coronary angioplasty implant and graft: Secondary | ICD-10-CM

## 2021-03-27 DIAGNOSIS — Z48812 Encounter for surgical aftercare following surgery on the circulatory system: Secondary | ICD-10-CM | POA: Diagnosis not present

## 2021-03-27 DIAGNOSIS — I252 Old myocardial infarction: Secondary | ICD-10-CM | POA: Diagnosis not present

## 2021-03-30 ENCOUNTER — Other Ambulatory Visit: Payer: Self-pay

## 2021-03-30 ENCOUNTER — Encounter (HOSPITAL_COMMUNITY)
Admission: RE | Admit: 2021-03-30 | Discharge: 2021-03-30 | Disposition: A | Discharge status: Home / Self Care | Payer: Medicare PPO | Source: Ambulatory Visit | Attending: Internal Medicine | Admitting: Internal Medicine

## 2021-03-30 DIAGNOSIS — Z955 Presence of coronary angioplasty implant and graft: Secondary | ICD-10-CM | POA: Diagnosis not present

## 2021-03-30 DIAGNOSIS — I214 Non-ST elevation (NSTEMI) myocardial infarction: Secondary | ICD-10-CM

## 2021-03-30 DIAGNOSIS — Z48812 Encounter for surgical aftercare following surgery on the circulatory system: Secondary | ICD-10-CM | POA: Diagnosis not present

## 2021-03-30 DIAGNOSIS — I252 Old myocardial infarction: Secondary | ICD-10-CM | POA: Diagnosis not present

## 2021-03-30 NOTE — Progress Notes (Signed)
Cardiac Individual Treatment Plan  Patient Details  Name: Debbie Frazier MRN: [id number] Date of Birth: [birth year] Referring Provider:   Flowsheet Row CARDIAC REHAB PHASE II ORIENTATION from 03/10/2021 in [facility]  Referring Provider Debara Pickett, Nadean Corwin, MD       Initial Encounter Date:  Flowsheet Row CARDIAC REHAB PHASE II ORIENTATION from 03/10/2021 in [facility]  Date 03/10/21       Visit Diagnosis: 01/08/21 NSTEMI  01/09/21 DES LAD  Patient's Home Medications on Admission:  Current Outpatient Medications:    acyclovir (ZOVIRAX) 200 MG capsule, Take 20 mg by mouth as needed (flare)., Disp: , Rfl:    aspirin EC 81 MG EC tablet, Take 1 tablet (81 mg total) by mouth daily. Swallow whole., Disp: 30 tablet, Rfl: 11   atorvastatin (LIPITOR) 80 MG tablet, Take 1 tablet (80 mg total) by mouth daily., Disp: 30 tablet, Rfl: 11   carvedilol (COREG) 3.125 MG tablet, Take 1 tablet (3.125 mg total) by mouth 2 (two) times daily with a meal., Disp: 60 tablet, Rfl: 11   clobetasol (TEMOVATE) 0.05 % external solution, Apply 1 application topically as needed (scalp)., Disp: , Rfl:    fluorometholone (FML) 0.1 % ophthalmic suspension, Place 1 drop into both eyes as needed (irritation)., Disp: , Rfl:    hydrOXYzine (ATARAX/VISTARIL) 25 MG tablet, Take 25 mg by mouth as needed for anxiety., Disp: , Rfl:    losartan (COZAAR) 25 MG tablet, Take 1 tablet (25 mg total) by mouth daily., Disp: 30 tablet, Rfl: 11   nitroGLYCERIN (NITROSTAT) 0.4 MG SL tablet, Place 1 tablet (0.4 mg total) under the tongue every 5 (five) minutes x 3 doses as needed for chest pain., Disp: 25 tablet, Rfl: 11   sertraline (ZOLOFT) 100 MG tablet, Take 100 mg by mouth daily. Take 1.5 Tablets at Bedtime, Disp: , Rfl:    SYNTHROID 150 MCG tablet, Take 150 mcg by mouth at bedtime., Disp: , Rfl:    ticagrelor (BRILINTA) 90 MG TABS tablet, Take 1 tablet (90 mg total) by  mouth 2 (two) times daily., Disp: 180 tablet, Rfl: 3   timolol (TIMOPTIC) 0.5 % ophthalmic solution, Place 1 drop into the left eye 2 (two) times daily., Disp: , Rfl:   Past Medical History: Past Medical History:  Diagnosis Date   Coronary Artery Disease 01/10/2021   NSTEMI 12/2020 - Cath: pLAD 75, mLAD 99 >> s/p DES; oLCx 40   Depression    HFrEF (heart failure with reduced ejection fraction)    Ischemic CM // Echo 4/22: apical / apical ant / apical septal AK; EF 35-40, mild asymmetric LVH, Gr 2 DD, normal RVSF, mild LAE, trivial MR, mild AI   High cholesterol    Hypothyroidism    Non-ST elevated myocardial infarction 01/08/2021   12/2020 - S/p 3 x 28 mm Synergy DES to prox-mid LAD   OSA on CPAP     Tobacco Use: Social History   Tobacco Use  Smoking Status Never  Smokeless Tobacco Never    Labs: Recent Review Flowsheet Data     Labs for ITP Cardiac and Pulmonary Rehab Latest Ref Rng & Units 01/09/2021 03/24/2021   Cholestrol 100 - 199 mg/dL 170 103   LDLCALC 0 - 99 mg/dL 91 49   HDL >39 mg/dL 45 38(L)   Trlycerides 0 - 149 mg/dL 171(H) 75       Capillary Blood Glucose: No results found for: GLUCAP  Exercise Target Goals: Exercise Program Goal: Individual exercise prescription set using results from initial 6 min walk test and THRR while considering  patient's activity barriers and safety.   Exercise Prescription Goal: Initial exercise prescription builds to 30-45 minutes a day of aerobic activity, 2-3 days per week.  Home exercise guidelines will be given to patient during program as part of exercise prescription that the participant will acknowledge.  Activity Barriers & Risk Stratification:  Activity Barriers & Cardiac Risk Stratification - 03/10/21 1442       Activity Barriers & Cardiac Risk Stratification   Activity Barriers Left Knee Replacement;Right Knee Replacement;History of Falls;Balance Concerns;Other (comment)    Comments Vestibular disorder,  occassionally effects balance. Glaucoma, occassionally effects depth perception going up and down stairs.    Cardiac Risk Stratification High             6 Minute Walk:  6 Minute Walk     Row Name 03/10/21 1502         6 Minute Walk   Phase Initial     Distance 1613 feet     Walk Time 6 minutes     # of Rest Breaks 0     MPH 3.05     METS 3.67     RPE 12     Perceived Dyspnea  1     VO2 Peak 12.86     Symptoms Yes (comment)     Comments Mild SOB     Resting HR 76 bpm     Resting BP 114/68     Resting Oxygen Saturation  99 %     Exercise Oxygen Saturation  during 6 min walk 96 %     Max Ex. HR 101 bpm     Max Ex. BP 120/70     2 Minute Post BP 128/62              Oxygen Initial Assessment:   Oxygen Re-Evaluation:   Oxygen Discharge (Final Oxygen Re-Evaluation):   Initial Exercise Prescription:  Initial Exercise Prescription - 03/10/21 1500       Date of Initial Exercise RX and Referring Provider   Date 03/10/21    Referring Provider Pixie Casino, MD    Expected Discharge Date 05/08/21      Treadmill   MPH 3    Grade 0    Minutes 15    METs 3.3      Recumbant Bike   Level 3    Watts 35    Minutes 15    METs 3.66      Prescription Details   Frequency (times per week) 3    Duration Progress to 30 minutes of continuous aerobic without signs/symptoms of physical distress      Intensity   THRR 40-80% of Max Heartrate 62-123    Ratings of Perceived Exertion 11-13    Perceived Dyspnea 0-4      Progression   Progression Continue to progress workloads to maintain intensity without signs/symptoms of physical distress.      Resistance Training   Training Prescription Yes    Weight 3 lbs    Reps 10-15             Perform Capillary Blood Glucose checks as needed.  Exercise Prescription Changes:   Exercise Prescription Changes     Row Name 03/18/21 1049 03/27/21 1046 03/30/21 1051         Response to Exercise   Blood  Pressure (Admit) 102/58  100/58 104/62     Blood Pressure (Exercise) 128/66 126/60 140/60     Blood Pressure (Exit) 98/61 106/60 100/72     Heart Rate (Admit) 67 bpm 81 bpm 70 bpm     Heart Rate (Exercise) 85 bpm 105 bpm 112 bpm     Heart Rate (Exit) 67 bpm 83 bpm 70 bpm     Rating of Perceived Exertion (Exercise) _0 Symptoms None None None     Comments Off to a good start with exercise. -- --     Duration Continue with 30 min of aerobic exercise without signs/symptoms of physical distress. Continue with 30 min of aerobic exercise without signs/symptoms of physical distress. Continue with 30 min of aerobic exercise without signs/symptoms of physical distress.     Intensity THRR unchanged THRR unchanged THRR unchanged           Progression       Progression Continue to progress workloads to maintain intensity without signs/symptoms of physical distress. Continue to progress workloads to maintain intensity without signs/symptoms of physical distress. Continue to progress workloads to maintain intensity without signs/symptoms of physical distress.     Average METs 3.5 4.1 4.3           Resistance Training       Training Prescription No Yes Yes     Weight -- 4 lbs 4 lbs     Reps -- 10-15 10-15     Time -- 10 Minutes 10 Minutes           Interval Training       Interval Training No No No           Treadmill       MPH 2.5 2.7 3     Grade 0 0 0     Minutes _1 METs 2.91 3.07 3.3           Recumbant Bike       Level 3 3.5 3.5     Minutes _2 METs 4.2 5.1 5.4           Home Exercise Plan       Plans to continue exercise at -- Home (comment)  Walking Home (comment)  Walking     Frequency -- Add 4 additional days to program exercise sessions. Add 4 additional days to program exercise sessions.     Initial Home Exercises Provided -- 03/25/21 03/25/21             Exercise Comments:   Exercise Comments     Row Name 03/18/21 1049 03/25/21 1113          Exercise Comments Patient tolerated exercise well without symptoms. Reviewed home exercise guidelines and goals with patient.               Exercise Goals and Review:   Exercise Goals     Row Name 03/10/21 1422             Exercise Goals   Increase Physical Activity Yes       Intervention Provide advice, education, support and counseling about physical activity/exercise needs.;Develop an individualized exercise prescription for aerobic and resistive training based on initial evaluation findings, risk stratification, comorbidities and participant's personal goals.       Expected Outcomes Short Term: Attend rehab on a regular basis to increase amount of physical activity.;Long Term: Exercising regularly at  least 3-5 days a week.;Long Term: Add in home exercise to make exercise part of routine and to increase amount of physical activity.       Increase Strength and Stamina Yes       Intervention Provide advice, education, support and counseling about physical activity/exercise needs.;Develop an individualized exercise prescription for aerobic and resistive training based on initial evaluation findings, risk stratification, comorbidities and participant's personal goals.       Expected Outcomes Short Term: Increase workloads from initial exercise prescription for resistance, speed, and METs.;Short Term: Perform resistance training exercises routinely during rehab and add in resistance training at home;Long Term: Improve cardiorespiratory fitness, muscular endurance and strength as measured by increased METs and functional capacity (6MWT)       Able to understand and use rate of perceived exertion (RPE) scale Yes       Intervention Provide education and explanation on how to use RPE scale       Expected Outcomes Short Term: Able to use RPE daily in rehab to express subjective intensity level;Long Term:  Able to use RPE to guide intensity level when exercising independently        Knowledge and understanding of Target Heart Rate Range (THRR) Yes       Intervention Provide education and explanation of THRR including how the numbers were predicted and where they are located for reference       Expected Outcomes Short Term: Able to state/look up THRR;Long Term: Able to use THRR to govern intensity when exercising independently;Short Term: Able to use daily as guideline for intensity in rehab       Able to check pulse independently Yes       Intervention Provide education and demonstration on how to check pulse in carotid and radial arteries.;Review the importance of being able to check your own pulse for safety during independent exercise       Expected Outcomes Short Term: Able to explain why pulse checking is important during independent exercise;Long Term: Able to check pulse independently and accurately       Understanding of Exercise Prescription Yes       Intervention Provide education, explanation, and written materials on patient's individual exercise prescription       Expected Outcomes Short Term: Able to explain program exercise prescription;Long Term: Able to explain home exercise prescription to exercise independently                Exercise Goals Re-Evaluation :  Exercise Goals Re-Evaluation     Row Name 03/18/21 1049 03/25/21 1113           Exercise Goal Re-Evaluation   Exercise Goals Review Increase Physical Activity;Able to understand and use rate of perceived exertion (RPE) scale Increase Physical Activity;Able to understand and use rate of perceived exertion (RPE) scale;Increase Strength and Stamina;Knowledge and understanding of Target Heart Rate Range (THRR);Understanding of Exercise Prescription      Comments Patient able to understand and use RPE scale appropriately. Reviewed exercise prescription with patient, and patient is walking 30 minutes on the days she doesn't attend cardiac rehab. Patient has reviewed the pulse counting handout, and I  encouraged her to practice coutning her pulse while she's at cardiac rehab.      Expected Outcomes Progress workloads as tolerated to help increase cardiorespiratory fitness. Patient will continue walking at least 30 minutes in addition to exercise at cardiac rehab to help build strength and stamina.  Discharge Exercise Prescription (Final Exercise Prescription Changes):  Exercise Prescription Changes - 03/30/21 1051       Response to Exercise   Blood Pressure (Admit) 104/62    Blood Pressure (Exercise) 140/60    Blood Pressure (Exit) 100/72    Heart Rate (Admit) 70 bpm    Heart Rate (Exercise) 112 bpm    Heart Rate (Exit) 70 bpm    Rating of Perceived Exertion (Exercise) 12    Symptoms None    Duration Continue with 30 min of aerobic exercise without signs/symptoms of physical distress.    Intensity THRR unchanged      Progression   Progression Continue to progress workloads to maintain intensity without signs/symptoms of physical distress.    Average METs 4.3      Resistance Training   Training Prescription Yes    Weight 4 lbs    Reps 10-15    Time 10 Minutes      Interval Training   Interval Training No      Treadmill   MPH 3    Grade 0    Minutes 15    METs 3.3      Recumbant Bike   Level 3.5    Minutes 15    METs 5.4      Home Exercise Plan   Plans to continue exercise at Home (comment)   Walking   Frequency Add 4 additional days to program exercise sessions.    Initial Home Exercises Provided 03/25/21             Nutrition:  Target Goals: Understanding of nutrition guidelines, daily intake of sodium '1500mg'$ , cholesterol '200mg'$ , calories 30% from fat and 7% or less from saturated fats, daily to have 5 or more servings of fruits and vegetables.  Biometrics:  Pre Biometrics - 03/10/21 1421       Pre Biometrics   Waist Circumference 37.5 inches    Hip Circumference 39.75 inches    Waist to Hip Ratio 0.94 %    Triceps Skinfold 12 mm     % Body Fat 33.9 %    Grip Strength 28.5 kg    Flexibility 19.75 in    Single Leg Stand 2.68 seconds              Nutrition Therapy Plan and Nutrition Goals:  Nutrition Therapy & Goals - 03/30/21 1120       Nutrition Therapy   Diet TLC    Drug/Food Interactions Statins/Certain Fruits      Personal Nutrition Goals   Nutrition Goal Pt to build a healthy plate including vegetables, fruits, whole grains, and low-fat dairy products in a heart healthy meal plan.    Personal Goal #2 Pt to read labels to continue eating a low sodium diet <1500 mg/day    Personal Goal #3 Pt to incorporate 10-20 g soluble fiber daily      Intervention Plan   Intervention Nutrition handout(s) given to patient.;Prescribe, educate and counsel regarding individualized specific dietary modifications aiming towards targeted core components such as weight, hypertension, lipid management, diabetes, heart failure and other comorbidities.    Expected Outcomes Long Term Goal: Adherence to prescribed nutrition plan.;Short Term Goal: Understand basic principles of dietary content, such as calories, fat, sodium, cholesterol and nutrients.             Nutrition Assessments:  MEDIFICTS Score Key: ?70 Need to make dietary changes  40-70 Heart Healthy Diet ? 40 Therapeutic Level Cholesterol Diet   Flowsheet Row  CARDIAC REHAB PHASE II EXERCISE from 03/18/2021 in [facility] [place]  Picture Your Plate Total Score on Admission 77      Picture Your Plate Scores: <66 Unhealthy dietary pattern with much room for improvement. 41-50 Dietary pattern unlikely to meet recommendations for good health and room for improvement. 51-60 More healthful dietary pattern, with some room for improvement.  >60 Healthy dietary pattern, although there may be some specific behaviors that could be improved.    Nutrition Goals Re-Evaluation:  Nutrition Goals Re-Evaluation     [place] Name 03/30/21 1120              Goals   Current Weight 146 lb 2.6 oz (66.3 kg)                Nutrition Goals Re-Evaluation:  Nutrition Goals Re-Evaluation     [facility] Name 03/30/21 1120             Goals   Current Weight 146 lb 2.6 oz (66.3 kg)                Nutrition Goals Discharge (Final Nutrition Goals Re-Evaluation):  Nutrition Goals Re-Evaluation - 03/30/21 1120       Goals   Current Weight 146 lb 2.6 oz (66.3 kg)             Psychosocial: Target Goals: Acknowledge presence or absence of significant depression and/or stress, maximize coping skills, provide positive support system. Participant is able to verbalize types and ability to use techniques and skills needed for reducing stress and depression.  Initial Review & Psychosocial Screening:  Initial Psych Review & Screening - 03/10/21 1559       Initial Review   Current issues with History of Depression      Family Dynamics   Good Support System? Yes   Caoimhe lives alone. Janneth has her daughter who lives in town and her elderly mother for support     Barriers   Psychosocial barriers to participate in program There are no identifiable barriers or psychosocial needs.      Screening Interventions   Interventions Encouraged to exercise             Quality of Life Scores:  Quality of Life - 03/10/21 1552       Quality of Life   Select Quality of Life      Quality of Life Scores   Health/Function Pre 23.89 %    Socioeconomic Pre 26.79 %    Psych/Spiritual Pre 24.86 %    Family Pre 30 %    GLOBAL Pre 25.5 %            Scores of 19 and below usually indicate a poorer quality of life in these areas.  A difference of  2-3 points is a clinically meaningful difference.  A difference of 2-3 points in the total score of the Quality of Life Index has been associated with significant improvement in overall quality of life, self-image, physical symptoms, and general health in studies assessing change in quality of  life.  PHQ-9: Recent Review Flowsheet Data     Depression screen [facility] 2/9 03/10/2021   Decreased Interest 0   Down, Depressed, Hopeless 0   PHQ - 2 Score 0      Interpretation of Total Score  Total Score Depression Severity:  1-4 = Minimal depression, 5-9 = Mild depression, 10-14 = Moderate depression, 15-19 = Moderately severe depression, 20-27 = Severe depression  Psychosocial Evaluation and Intervention:   Psychosocial Re-Evaluation:  Psychosocial Re-Evaluation     [place] Name 03/18/21 1627 04/01/21 1209           Psychosocial Re-Evaluation   Current issues with History of Depression History of Depression      Comments Hilma started exercise at cardiac rehab on 03/18/21 will continue to monitor and offer support as needed Sheketa has not voiced any increased depression or stressors since participating in phase 2 cardiac rehab      Expected Outcomes Bretta will have decreased or controlled stress upon completion of phase 2 cardiac rehab Aleatha will have decreased or controlled stress upon completion of phase 2 cardiac rehab      Interventions Stress management education;Encouraged to attend Cardiac Rehabilitation for the exercise Stress management education;Encouraged to attend Cardiac Rehabilitation for the exercise      Continue Psychosocial Services  Follow up required by staff No Follow up required               Psychosocial Discharge (Final Psychosocial Re-Evaluation):  Psychosocial Re-Evaluation - 04/01/21 1209       Psychosocial Re-Evaluation   Current issues with History of Depression    Comments Kaylenn has not voiced any increased depression or stressors since participating in phase 2 cardiac rehab    Expected Outcomes Illiana will have decreased or controlled stress upon completion of phase 2 cardiac rehab    Interventions Stress management education;Encouraged to attend Cardiac Rehabilitation for the exercise    Continue Psychosocial Services  No Follow up  required             Vocational Rehabilitation: Provide vocational rehab assistance to qualifying candidates.   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 Participant will state understanding/return demonstration of topics presented.  Learning Barriers/Preferences:  Learning Barriers/Preferences - 03/10/21 1600       Learning Barriers/Preferences   Learning Barriers Sight   Has Glaucoma   Learning Preferences Skilled Demonstration;Video;Pictoral             Education Topics: Count Your Pulse:  -Group instruction provided by verbal instruction, demonstration, patient participation and written materials to support subject.  Instructors address importance of being able to find your pulse and how to count your pulse when at home without a heart monitor.  Patients get hands on experience counting their pulse with staff help and individually.   Heart Attack, Angina, and Risk Factor Modification:  -Group instruction provided by verbal instruction, video, and written materials to support subject.  Instructors address signs and symptoms of angina and heart attacks.    Also discuss risk factors for heart disease and how to make changes to improve heart health risk factors.   Functional Fitness:  -Group instruction provided by verbal instruction, demonstration, patient participation, and written materials to support subject.  Instructors address safety measures for doing things around the house.  Discuss how to get up and down off the floor, how to pick things up properly, how to safely get out of a chair without assistance, and balance  training.   Meditation and Mindfulness:  -Group  instruction provided by verbal instruction, patient participation, and written materials to support subject.  Instructor addresses importance of mindfulness and meditation practice to help reduce stress and improve awareness.  Instructor also leads participants through a meditation exercise.    Stretching for Flexibility and Mobility:  -Group instruction provided by verbal instruction, patient participation, and written materials to support subject.  Instructors lead participants through series of stretches that are designed to increase flexibility thus improving mobility.  These stretches are additional exercise for major muscle groups that are typically performed during regular warm up and cool down.   Hands Only CPR:  -Group verbal, video, and participation provides a basic overview of AHA guidelines for community CPR. Role-play of emergencies allow participants the opportunity to practice calling for help and chest compression technique with discussion of AED use.   Hypertension: -Group verbal and written instruction that provides a basic overview of hypertension including the most recent diagnostic guidelines, risk factor reduction with self-care instructions and medication management.    Nutrition I class: Heart Healthy Eating:  -Group instruction provided by PowerPoint slides, verbal discussion, and written materials to support subject matter. The instructor gives an explanation and review of the Therapeutic Lifestyle Changes diet recommendations, which includes a discussion on lipid goals, dietary fat, sodium, fiber, plant stanol/sterol esters, sugar, and the components of a well-balanced, healthy diet.   Nutrition II class: Lifestyle Skills:  -Group instruction provided by PowerPoint slides, verbal discussion, and written materials to support subject matter. The instructor gives an explanation and review of label reading, grocery  shopping for heart health, heart healthy recipe modifications, and ways to make healthier choices when eating out.   Diabetes Question & Answer:  -Group instruction provided by PowerPoint slides, verbal discussion, and written materials to support subject matter. The instructor gives an explanation and review of diabetes co-morbidities, pre- and post-prandial blood glucose goals, pre-exercise blood glucose goals, signs, symptoms, and treatment of hypoglycemia and hyperglycemia, and foot care basics.   Diabetes Blitz:  -Group instruction provided by PowerPoint slides, verbal discussion, and written materials to support subject matter. The instructor gives an explanation and review of the physiology behind type 1 and type 2 diabetes, diabetes medications and rational behind using different medications, pre- and post-prandial blood glucose recommendations and Hemoglobin A1c goals, diabetes diet, and exercise including blood glucose guidelines for exercising safely.    Portion Distortion:  -Group instruction provided by PowerPoint slides, verbal discussion, written materials, and food models to support subject matter. The instructor gives an explanation of serving size versus portion size, changes in portions sizes over the last 20 years, and what consists of a serving from each food group.   Stress Management:  -Group instruction provided by verbal instruction, video, and written materials to support subject matter.  Instructors review role of stress in heart disease and how to cope with stress positively.     Exercising on Your Own:  -Group instruction provided by verbal instruction, power point, and written materials to support subject.  Instructors discuss benefits of exercise, components of exercise, frequency and intensity of exercise, and end points for exercise.  Also discuss use of nitroglycerin and activating EMS.  Review options of places to exercise outside of rehab.  Review guidelines  for sex with heart disease.   Cardiac Drugs I:  -Group instruction provided by verbal instruction and written materials to support subject.  Instructor reviews cardiac drug classes: antiplatelets, anticoagulants, beta blockers, and statins.  Instructor discusses reasons, side effects, and  lifestyle considerations for each drug class.   Cardiac Drugs II:  -Group instruction provided by verbal instruction and written materials to support subject.  Instructor reviews cardiac drug classes: angiotensin converting enzyme inhibitors (ACE-I), angiotensin II receptor blockers (ARBs), nitrates, and calcium channel blockers.  Instructor discusses reasons, side effects, and lifestyle considerations for each drug class.   Anatomy and Physiology of the Circulatory System:  Group verbal and written instruction and models provide basic cardiac anatomy and physiology, with the coronary electrical and arterial systems. Review of: AMI, Angina, Valve disease, Heart Failure, Peripheral Artery Disease, Cardiac Arrhythmia, Pacemakers, and the ICD.   Other Education:  -Group or individual verbal, written, or video instructions that support the educational goals of the cardiac rehab program.   Holiday Eating Survival Tips:  -Group instruction provided by PowerPoint slides, verbal discussion, and written materials to support subject matter. The instructor gives patients tips, tricks, and techniques to help them not only survive but enjoy the holidays despite the onslaught of food that accompanies the holidays.   Knowledge Questionnaire Score:  Knowledge Questionnaire Score - 03/10/21 1552       Knowledge Questionnaire Score   Pre Score 24/24             Core Components/Risk Factors/Patient Goals at Admission:  Personal Goals and Risk Factors at Admission - 03/10/21 1442       Core Components/Risk Factors/Patient Goals on Admission    Weight Management Yes;Weight Maintenance    Intervention Weight  Management: Provide education and appropriate resources to help participant work on and attain dietary goals.;Weight Management: Develop a combined nutrition and exercise program designed to reach desired caloric intake, while maintaining appropriate intake of nutrient and fiber, sodium and fats, and appropriate energy expenditure required for the weight goal.    Expected Outcomes Understanding of distribution of calorie intake throughout the day with the consumption of 4-5 meals/snacks;Understanding recommendations for meals to include 15-35% energy as protein, 25-35% energy from fat, 35-60% energy from carbohydrates, less than 286m of dietary cholesterol, 20-35 gm of total fiber daily;Weight Maintenance: Understanding of the daily nutrition guidelines, which includes 25-35% calories from fat, 7% or less cal from saturated fats, less than 2044mcholesterol, less than 1.5gm of sodium, & 5 or more servings of fruits and vegetables daily    Heart Failure Yes    Intervention Provide a combined exercise and nutrition program that is supplemented with education, support and counseling about heart failure. Directed toward relieving symptoms such as shortness of breath, decreased exercise tolerance, and extremity edema.    Expected Outcomes Improve functional capacity of life;Short term: Attendance in program 2-3 days a week with increased exercise capacity. Reported lower sodium intake. Reported increased fruit and vegetable intake. Reports medication compliance.;Short term: Daily weights obtained and reported for increase. Utilizing diuretic protocols set by physician.;Long term: Adoption of self-care skills and reduction of barriers for early signs and symptoms recognition and intervention leading to self-care maintenance.    Lipids Yes    Intervention Provide education and support for participant on nutrition & aerobic/resistive exercise along with prescribed medications to achieve LDL <7031mHDL >15m52m   Expected Outcomes Short Term: Participant states understanding of desired cholesterol values and is compliant with medications prescribed. Participant is following exercise prescription and nutrition guidelines.;Long Term: Cholesterol controlled with medications as prescribed, with individualized exercise RX and with personalized nutrition plan. Value goals: LDL < 70mg30mL > 40 mg.    Stress Yes    Intervention  Offer individual and/or small group education and counseling on adjustment to heart disease, stress management and health-related lifestyle change. Teach and support self-help strategies.;Refer participants experiencing significant psychosocial distress to appropriate mental health specialists for further evaluation and treatment. When possible, include family members and significant others in education/counseling sessions.    Expected Outcomes Short Term: Participant demonstrates changes in health-related behavior, relaxation and other stress management skills, ability to obtain effective social support, and compliance with psychotropic medications if prescribed.;Long Term: Emotional wellbeing is indicated by absence of clinically significant psychosocial distress or social isolation.    Personal Goal Other Yes    Personal Goal Improve ejection fraction/ improve heart fucntion so that she can do ADLs, be able to drink more water, be able to discontinue some medications with physician approval.    Intervention Provide a exercise routine with aerobic training, resistance training, and stretching exercises to help build cardiorespiratory fitness and improve heart function.    Expected Outcomes Adhere to consistent exercise routine to help build strength and stamina and improve functional capacity as measured by MET level and 6 MWT.             Core Components/Risk Factors/Patient Goals Review:   Goals and Risk Factor Review     Row Name 03/18/21 1630 04/01/21 1210           Core  Components/Risk Factors/Patient Goals Review   Personal Goals Review Weight Management/Obesity;Heart Failure;Lipids;Stress Weight Management/Obesity;Heart Failure;Lipids;Stress      Review Tana started exercise at cardiac rehab on 03/18/21. VSS. Bridgitte did well with exercise Mylani has been doing well with exercise. Some resting systolic BP's in the 99'Y noted. patient asymptomatic. Will continue to monitor BP      Expected Outcomes Vitoria will continue to participate in phase 2 cardiac rehab for exercise, nutrition and lifestyle modifications Katina will continue to participate in phase 2 cardiac rehab for exercise, nutrition and lifestyle modifications               Core Components/Risk Factors/Patient Goals at Discharge (Final Review):   Goals and Risk Factor Review - 04/01/21 1210       Core Components/Risk Factors/Patient Goals Review   Personal Goals Review Weight Management/Obesity;Heart Failure;Lipids;Stress    Review Nicha has been doing well with exercise. Some resting systolic BP's in the 72'J noted. patient asymptomatic. Will continue to monitor BP    Expected Outcomes Tela will continue to participate in phase 2 cardiac rehab for exercise, nutrition and lifestyle modifications             ITP Comments:  ITP Comments     Row Name 03/10/21 1421 03/18/21 1625 04/01/21 1208       ITP Comments Medical Director- Dr. Fransico Him, MD 30 Day ITP Review. Maribelle started exercise at cardiac rehab on 03/18/21 and did well with exercise. 30 Day ITP Review. Cherrell has good attendance and partcipation in phase 2 cardiac rehab              Comments: See ITP Comments

## 2021-03-31 ENCOUNTER — Ambulatory Visit (HOSPITAL_COMMUNITY): Payer: Medicare PPO

## 2021-04-01 ENCOUNTER — Other Ambulatory Visit: Payer: Self-pay

## 2021-04-01 ENCOUNTER — Encounter (HOSPITAL_COMMUNITY)
Admission: RE | Admit: 2021-04-01 | Discharge: 2021-04-01 | Disposition: A | Discharge status: Home / Self Care | Payer: Medicare PPO | Source: Ambulatory Visit | Attending: Internal Medicine | Admitting: Internal Medicine

## 2021-04-01 DIAGNOSIS — Z955 Presence of coronary angioplasty implant and graft: Secondary | ICD-10-CM | POA: Diagnosis not present

## 2021-04-01 DIAGNOSIS — Z48812 Encounter for surgical aftercare following surgery on the circulatory system: Secondary | ICD-10-CM | POA: Diagnosis not present

## 2021-04-01 DIAGNOSIS — I214 Non-ST elevation (NSTEMI) myocardial infarction: Secondary | ICD-10-CM

## 2021-04-01 DIAGNOSIS — I252 Old myocardial infarction: Secondary | ICD-10-CM | POA: Diagnosis not present

## 2021-04-03 ENCOUNTER — Encounter (HOSPITAL_COMMUNITY)
Admission: RE | Admit: 2021-04-03 | Discharge: 2021-04-03 | Disposition: A | Discharge status: Home / Self Care | Payer: Medicare PPO | Source: Ambulatory Visit | Attending: Internal Medicine | Admitting: Internal Medicine

## 2021-04-03 ENCOUNTER — Other Ambulatory Visit: Payer: Self-pay

## 2021-04-03 DIAGNOSIS — Z955 Presence of coronary angioplasty implant and graft: Secondary | ICD-10-CM

## 2021-04-03 DIAGNOSIS — I252 Old myocardial infarction: Secondary | ICD-10-CM | POA: Diagnosis not present

## 2021-04-03 DIAGNOSIS — Z48812 Encounter for surgical aftercare following surgery on the circulatory system: Secondary | ICD-10-CM | POA: Diagnosis not present

## 2021-04-03 DIAGNOSIS — I214 Non-ST elevation (NSTEMI) myocardial infarction: Secondary | ICD-10-CM

## 2021-04-06 ENCOUNTER — Encounter (HOSPITAL_COMMUNITY): Payer: Medicare PPO

## 2021-04-06 ENCOUNTER — Encounter (HOSPITAL_COMMUNITY)
Admission: RE | Admit: 2021-04-06 | Discharge: 2021-04-06 | Disposition: A | Discharge status: Home / Self Care | Payer: Medicare PPO | Source: Ambulatory Visit | Attending: Internal Medicine | Admitting: Internal Medicine

## 2021-04-06 ENCOUNTER — Other Ambulatory Visit: Payer: Self-pay

## 2021-04-06 DIAGNOSIS — Z955 Presence of coronary angioplasty implant and graft: Secondary | ICD-10-CM | POA: Diagnosis not present

## 2021-04-06 DIAGNOSIS — Z48812 Encounter for surgical aftercare following surgery on the circulatory system: Secondary | ICD-10-CM | POA: Diagnosis not present

## 2021-04-06 DIAGNOSIS — I214 Non-ST elevation (NSTEMI) myocardial infarction: Secondary | ICD-10-CM

## 2021-04-06 DIAGNOSIS — I252 Old myocardial infarction: Secondary | ICD-10-CM | POA: Diagnosis not present

## 2021-04-06 NOTE — Progress Notes (Signed)
Entry blood pressure 90/53. Patient asymptomatic. Recheck BP 111/77 via auto cuff. BP noted at 122/62 on the recumbent bike. Exit BP 108/52. Debbie Frazier has had some intermittent resting systolic BP's in the 90's. Will fax exercise flow sheets to Dr. Blanchie Dessert  office for review. Medications reviewed with Debbie Frazier. Taking as prescribed.Gladstone Lighter, RN,BSN 04/06/2021 12:21 PM

## 2021-04-07 DIAGNOSIS — H401233 Low-tension glaucoma, bilateral, severe stage: Secondary | ICD-10-CM | POA: Diagnosis not present

## 2021-04-07 DIAGNOSIS — H0102B Squamous blepharitis left eye, upper and lower eyelids: Secondary | ICD-10-CM | POA: Diagnosis not present

## 2021-04-07 DIAGNOSIS — H0102A Squamous blepharitis right eye, upper and lower eyelids: Secondary | ICD-10-CM | POA: Diagnosis not present

## 2021-04-08 ENCOUNTER — Encounter (HOSPITAL_COMMUNITY): Payer: Medicare PPO

## 2021-04-08 ENCOUNTER — Telehealth (HOSPITAL_COMMUNITY): Payer: Self-pay | Admitting: Family Medicine

## 2021-04-10 ENCOUNTER — Other Ambulatory Visit: Payer: Self-pay

## 2021-04-10 ENCOUNTER — Encounter (HOSPITAL_COMMUNITY): Payer: Medicare PPO

## 2021-04-10 ENCOUNTER — Encounter (HOSPITAL_COMMUNITY)
Admission: RE | Admit: 2021-04-10 | Discharge: 2021-04-10 | Disposition: A | Discharge status: Home / Self Care | Payer: Medicare PPO | Source: Ambulatory Visit | Attending: Internal Medicine | Admitting: Internal Medicine

## 2021-04-10 DIAGNOSIS — Z955 Presence of coronary angioplasty implant and graft: Secondary | ICD-10-CM

## 2021-04-10 DIAGNOSIS — I214 Non-ST elevation (NSTEMI) myocardial infarction: Secondary | ICD-10-CM

## 2021-04-10 DIAGNOSIS — Z48812 Encounter for surgical aftercare following surgery on the circulatory system: Secondary | ICD-10-CM | POA: Diagnosis not present

## 2021-04-10 DIAGNOSIS — I252 Old myocardial infarction: Secondary | ICD-10-CM | POA: Diagnosis not present

## 2021-04-10 NOTE — Progress Notes (Signed)
Nutrition Notes Follow Up  Reviewed goals with pt.  Updated lipid panel - LDL 49  mg/dl. Pt continues to reduce saturated fat and choose high fiber foods to reach fiber goals each day.  She choose whole grains, fruits, and veggies daily.  She is still monitoring sodium intake. She is reading labels.  We discussed cereal and breakfast ideas for on the go.  Provided pt with supporting handouts.  Nutrition Diagnosis  Food-and nutrition-related knowledge deficit related to lack of exposure to information as related to diagnosis of: ? CVD ?    Nutrition Intervention  Pt's individual nutrition plan reviewed with pt. Benefits of adopting Heart Healthy diet discussed when Picture Your Plate reviewed.                   Pt given handouts for: ? Nutrition I class ? Nutrition II class ?  Continue client-centered nutrition education by RD, as part of interdisciplinary care.   Goal(s) Pt to build a healthy plate including vegetables, fruits, whole grains, and low-fat dairy products in a heart healthy meal plan. Pt to read labels to continue eating a low sodium diet <1500 mg/day Pt to incorporate 10-20 g soluble fiber daily   Plan:    Will provide client-centered nutrition education as part of interdisciplinary care Monitor and evaluate progress toward nutrition goal with team.

## 2021-04-13 ENCOUNTER — Encounter (HOSPITAL_COMMUNITY): Payer: Medicare PPO

## 2021-04-13 ENCOUNTER — Encounter (HOSPITAL_COMMUNITY)
Admission: RE | Admit: 2021-04-13 | Discharge: 2021-04-13 | Disposition: A | Discharge status: Home / Self Care | Payer: Medicare PPO | Source: Ambulatory Visit | Attending: Internal Medicine | Admitting: Internal Medicine

## 2021-04-13 ENCOUNTER — Other Ambulatory Visit: Payer: Self-pay

## 2021-04-13 DIAGNOSIS — I252 Old myocardial infarction: Secondary | ICD-10-CM | POA: Diagnosis not present

## 2021-04-13 DIAGNOSIS — I214 Non-ST elevation (NSTEMI) myocardial infarction: Secondary | ICD-10-CM

## 2021-04-13 DIAGNOSIS — Z955 Presence of coronary angioplasty implant and graft: Secondary | ICD-10-CM

## 2021-04-13 DIAGNOSIS — Z5189 Encounter for other specified aftercare: Secondary | ICD-10-CM | POA: Diagnosis not present

## 2021-04-15 ENCOUNTER — Encounter (HOSPITAL_COMMUNITY)
Admission: RE | Admit: 2021-04-15 | Discharge: 2021-04-15 | Disposition: A | Discharge status: Home / Self Care | Payer: Medicare PPO | Source: Ambulatory Visit | Attending: Internal Medicine | Admitting: Internal Medicine

## 2021-04-15 ENCOUNTER — Other Ambulatory Visit: Payer: Self-pay

## 2021-04-15 ENCOUNTER — Encounter (HOSPITAL_COMMUNITY): Payer: Medicare PPO

## 2021-04-15 DIAGNOSIS — Z955 Presence of coronary angioplasty implant and graft: Secondary | ICD-10-CM | POA: Diagnosis not present

## 2021-04-15 DIAGNOSIS — I214 Non-ST elevation (NSTEMI) myocardial infarction: Secondary | ICD-10-CM

## 2021-04-15 DIAGNOSIS — Z5189 Encounter for other specified aftercare: Secondary | ICD-10-CM | POA: Diagnosis not present

## 2021-04-15 DIAGNOSIS — I252 Old myocardial infarction: Secondary | ICD-10-CM | POA: Diagnosis not present

## 2021-04-17 ENCOUNTER — Other Ambulatory Visit: Payer: Self-pay

## 2021-04-17 ENCOUNTER — Encounter (HOSPITAL_COMMUNITY): Payer: Medicare PPO

## 2021-04-17 ENCOUNTER — Encounter (HOSPITAL_COMMUNITY)
Admission: RE | Admit: 2021-04-17 | Discharge: 2021-04-17 | Disposition: A | Discharge status: Home / Self Care | Payer: Medicare PPO | Source: Ambulatory Visit | Attending: Internal Medicine | Admitting: Internal Medicine

## 2021-04-17 DIAGNOSIS — Z955 Presence of coronary angioplasty implant and graft: Secondary | ICD-10-CM | POA: Diagnosis not present

## 2021-04-17 DIAGNOSIS — I252 Old myocardial infarction: Secondary | ICD-10-CM | POA: Diagnosis not present

## 2021-04-17 DIAGNOSIS — Z5189 Encounter for other specified aftercare: Secondary | ICD-10-CM | POA: Diagnosis not present

## 2021-04-17 DIAGNOSIS — I214 Non-ST elevation (NSTEMI) myocardial infarction: Secondary | ICD-10-CM

## 2021-04-20 ENCOUNTER — Other Ambulatory Visit: Payer: Self-pay

## 2021-04-20 ENCOUNTER — Encounter (HOSPITAL_COMMUNITY): Payer: Medicare PPO

## 2021-04-20 ENCOUNTER — Encounter (HOSPITAL_COMMUNITY)
Admission: RE | Admit: 2021-04-20 | Discharge: 2021-04-20 | Disposition: A | Discharge status: Home / Self Care | Payer: Medicare PPO | Source: Ambulatory Visit | Attending: Internal Medicine | Admitting: Internal Medicine

## 2021-04-20 DIAGNOSIS — I214 Non-ST elevation (NSTEMI) myocardial infarction: Secondary | ICD-10-CM

## 2021-04-20 DIAGNOSIS — Z955 Presence of coronary angioplasty implant and graft: Secondary | ICD-10-CM | POA: Diagnosis not present

## 2021-04-20 DIAGNOSIS — I252 Old myocardial infarction: Secondary | ICD-10-CM | POA: Diagnosis not present

## 2021-04-20 DIAGNOSIS — Z5189 Encounter for other specified aftercare: Secondary | ICD-10-CM | POA: Diagnosis not present

## 2021-04-22 ENCOUNTER — Encounter (HOSPITAL_COMMUNITY): Payer: Medicare PPO

## 2021-04-22 ENCOUNTER — Encounter (HOSPITAL_COMMUNITY)
Admission: RE | Admit: 2021-04-22 | Discharge: 2021-04-22 | Disposition: A | Discharge status: Home / Self Care | Payer: Medicare PPO | Source: Ambulatory Visit | Attending: Internal Medicine | Admitting: Internal Medicine

## 2021-04-22 ENCOUNTER — Other Ambulatory Visit: Payer: Self-pay

## 2021-04-22 DIAGNOSIS — I214 Non-ST elevation (NSTEMI) myocardial infarction: Secondary | ICD-10-CM

## 2021-04-22 DIAGNOSIS — Z955 Presence of coronary angioplasty implant and graft: Secondary | ICD-10-CM

## 2021-04-22 DIAGNOSIS — Z5189 Encounter for other specified aftercare: Secondary | ICD-10-CM | POA: Diagnosis not present

## 2021-04-22 DIAGNOSIS — I252 Old myocardial infarction: Secondary | ICD-10-CM | POA: Diagnosis not present

## 2021-04-24 ENCOUNTER — Other Ambulatory Visit: Payer: Self-pay

## 2021-04-24 ENCOUNTER — Encounter (HOSPITAL_COMMUNITY): Payer: Medicare PPO

## 2021-04-24 ENCOUNTER — Encounter (HOSPITAL_COMMUNITY)
Admission: RE | Admit: 2021-04-24 | Discharge: 2021-04-24 | Disposition: A | Discharge status: Home / Self Care | Payer: Medicare PPO | Source: Ambulatory Visit | Attending: Internal Medicine | Admitting: Internal Medicine

## 2021-04-24 DIAGNOSIS — I252 Old myocardial infarction: Secondary | ICD-10-CM | POA: Diagnosis not present

## 2021-04-24 DIAGNOSIS — Z5189 Encounter for other specified aftercare: Secondary | ICD-10-CM | POA: Diagnosis not present

## 2021-04-24 DIAGNOSIS — I214 Non-ST elevation (NSTEMI) myocardial infarction: Secondary | ICD-10-CM

## 2021-04-24 DIAGNOSIS — Z955 Presence of coronary angioplasty implant and graft: Secondary | ICD-10-CM | POA: Diagnosis not present

## 2021-04-27 ENCOUNTER — Other Ambulatory Visit: Payer: Self-pay

## 2021-04-27 ENCOUNTER — Ambulatory Visit (HOSPITAL_COMMUNITY): Payer: Medicare PPO | Attending: Internal Medicine

## 2021-04-27 ENCOUNTER — Encounter (HOSPITAL_COMMUNITY): Payer: Medicare PPO

## 2021-04-27 ENCOUNTER — Encounter (HOSPITAL_COMMUNITY)
Admission: RE | Admit: 2021-04-27 | Discharge: 2021-04-27 | Disposition: A | Discharge status: Home / Self Care | Payer: Medicare PPO | Source: Ambulatory Visit | Attending: Internal Medicine | Admitting: Internal Medicine

## 2021-04-27 DIAGNOSIS — Z955 Presence of coronary angioplasty implant and graft: Secondary | ICD-10-CM | POA: Diagnosis not present

## 2021-04-27 DIAGNOSIS — I252 Old myocardial infarction: Secondary | ICD-10-CM | POA: Diagnosis not present

## 2021-04-27 DIAGNOSIS — Z5189 Encounter for other specified aftercare: Secondary | ICD-10-CM | POA: Diagnosis not present

## 2021-04-27 DIAGNOSIS — I255 Ischemic cardiomyopathy: Secondary | ICD-10-CM | POA: Diagnosis not present

## 2021-04-27 DIAGNOSIS — I214 Non-ST elevation (NSTEMI) myocardial infarction: Secondary | ICD-10-CM

## 2021-04-27 LAB — ECHOCARDIOGRAM COMPLETE
Area-P 1/2: 3.42 cm2
P 1/2 time: 587 msec
S' Lateral: 3.15 cm

## 2021-04-27 NOTE — Progress Notes (Signed)
Cardiac Individual Treatment Plan  Patient Details  Name: Debbie Frazier MRN: [id number] Date of Birth: [birth year] Referring Provider:   Flowsheet Row CARDIAC REHAB PHASE II ORIENTATION from 03/10/2021 in [facility]  Referring Provider Debara Pickett, Nadean Corwin, MD       Initial Encounter Date:  Flowsheet Row CARDIAC REHAB PHASE II ORIENTATION from 03/10/2021 in [facility]  Date 03/10/21       Visit Diagnosis: 01/08/21 NSTEMI  01/09/21 DES LAD  Patient's Home Medications on Admission:  Current Outpatient Medications:    acyclovir (ZOVIRAX) 200 MG capsule, Take 20 mg by mouth as needed (flare)., Disp: , Rfl:    aspirin EC 81 MG EC tablet, Take 1 tablet (81 mg total) by mouth daily. Swallow whole., Disp: 30 tablet, Rfl: 11   atorvastatin (LIPITOR) 80 MG tablet, Take 1 tablet (80 mg total) by mouth daily., Disp: 30 tablet, Rfl: 11   carvedilol (COREG) 3.125 MG tablet, Take 1 tablet (3.125 mg total) by mouth 2 (two) times daily with a meal., Disp: 60 tablet, Rfl: 11   clobetasol (TEMOVATE) 0.05 % external solution, Apply 1 application topically as needed (scalp)., Disp: , Rfl:    fluorometholone (FML) 0.1 % ophthalmic suspension, Place 1 drop into both eyes as needed (irritation)., Disp: , Rfl:    hydrOXYzine (ATARAX/VISTARIL) 25 MG tablet, Take 25 mg by mouth as needed for anxiety., Disp: , Rfl:    losartan (COZAAR) 25 MG tablet, Take 1 tablet (25 mg total) by mouth daily., Disp: 30 tablet, Rfl: 11   nitroGLYCERIN (NITROSTAT) 0.4 MG SL tablet, Place 1 tablet (0.4 mg total) under the tongue every 5 (five) minutes x 3 doses as needed for chest pain., Disp: 25 tablet, Rfl: 11   sertraline (ZOLOFT) 100 MG tablet, Take 100 mg by mouth daily. Take 1.5 Tablets at Bedtime, Disp: , Rfl:    SYNTHROID 150 MCG tablet, Take 150 mcg by mouth at bedtime., Disp: , Rfl:    ticagrelor (BRILINTA) 90 MG TABS tablet, Take 1 tablet (90 mg total) by  mouth 2 (two) times daily., Disp: 180 tablet, Rfl: 3   timolol (TIMOPTIC) 0.5 % ophthalmic solution, Place 1 drop into the left eye 2 (two) times daily., Disp: , Rfl:   Past Medical History: Past Medical History:  Diagnosis Date   Coronary Artery Disease 01/10/2021   NSTEMI 12/2020 - Cath: pLAD 75, mLAD 99 >> s/p DES; oLCx 40   Depression    HFrEF (heart failure with reduced ejection fraction)    Ischemic CM // Echo 4/22: apical / apical ant / apical septal AK; EF 35-40, mild asymmetric LVH, Gr 2 DD, normal RVSF, mild LAE, trivial MR, mild AI   High cholesterol    Hypothyroidism    Non-ST elevated myocardial infarction 01/08/2021   12/2020 - S/p 3 x 28 mm Synergy DES to prox-mid LAD   OSA on CPAP     Tobacco Use: Social History   Tobacco Use  Smoking Status Never  Smokeless Tobacco Never    Labs: Recent Review Flowsheet Data     Labs for ITP Cardiac and Pulmonary Rehab Latest Ref Rng & Units 01/09/2021 03/24/2021   Cholestrol 100 - 199 mg/dL 170 103   LDLCALC 0 - 99 mg/dL 91 49   HDL >39 mg/dL 45 38(L)   Trlycerides 0 - 149 mg/dL 171(H) 75       Capillary Blood Glucose: No results found for: GLUCAP  Exercise Target Goals: Exercise Program Goal: Individual exercise prescription set using results from initial 6 min walk test and THRR while considering  patient's activity barriers and safety.   Exercise Prescription Goal: Initial exercise prescription builds to 30-45 minutes a day of aerobic activity, 2-3 days per week.  Home exercise guidelines will be given to patient during program as part of exercise prescription that the participant will acknowledge.  Activity Barriers & Risk Stratification:  Activity Barriers & Cardiac Risk Stratification - 03/10/21 1442       Activity Barriers & Cardiac Risk Stratification   Activity Barriers Left Knee Replacement;Right Knee Replacement;History of Falls;Balance Concerns;Other (comment)    Comments Vestibular disorder,  occassionally effects balance. Glaucoma, occassionally effects depth perception going up and down stairs.    Cardiac Risk Stratification High             6 Minute Walk:  6 Minute Walk     Row Name 03/10/21 1502         6 Minute Walk   Phase Initial     Distance 1613 feet     Walk Time 6 minutes     # of Rest Breaks 0     MPH 3.05     METS 3.67     RPE 12     Perceived Dyspnea  1     VO2 Peak 12.86     Symptoms Yes (comment)     Comments Mild SOB     Resting HR 76 bpm     Resting BP 114/68     Resting Oxygen Saturation  99 %     Exercise Oxygen Saturation  during 6 min walk 96 %     Max Ex. HR 101 bpm     Max Ex. BP 120/70     2 Minute Post BP 128/62              Oxygen Initial Assessment:   Oxygen Re-Evaluation:   Oxygen Discharge (Final Oxygen Re-Evaluation):   Initial Exercise Prescription:  Initial Exercise Prescription - 03/10/21 1500       Date of Initial Exercise RX and Referring Provider   Date 03/10/21    Referring Provider Pixie Casino, MD    Expected Discharge Date 05/08/21      Treadmill   MPH 3    Grade 0    Minutes 15    METs 3.3      Recumbant Bike   Level 3    Watts 35    Minutes 15    METs 3.66      Prescription Details   Frequency (times per week) 3    Duration Progress to 30 minutes of continuous aerobic without signs/symptoms of physical distress      Intensity   THRR 40-80% of Max Heartrate 62-123    Ratings of Perceived Exertion 11-13    Perceived Dyspnea 0-4      Progression   Progression Continue to progress workloads to maintain intensity without signs/symptoms of physical distress.      Resistance Training   Training Prescription Yes    Weight 3 lbs    Reps 10-15             Perform Capillary Blood Glucose checks as needed.  Exercise Prescription Changes:   Exercise Prescription Changes     Row Name 03/18/21 1049 03/27/21 1046 03/30/21 1051 04/13/21 1052 04/27/21 1057     Response to  Exercise   Blood Pressure (Admit) 102/58  100/58 104/62 96/58 100/58   Blood Pressure (Exercise) 128/66 126/60 140/60 140/68 138/58   Blood Pressure (Exit) 98/61 106/60 100/72 114/74 95/62   Heart Rate (Admit) 67 bpm 81 bpm 70 bpm 79 bpm 64 bpm   Heart Rate (Exercise) 85 bpm 105 bpm 112 bpm 106 bpm 107 bpm   Heart Rate (Exit) 67 bpm 83 bpm 70 bpm 79 bpm 71 bpm   Rating of Perceived Exertion (Exercise) _0 Symptoms _1    Comments Off to a good start with exercise. -- -- Patient tried TM at 3.0 mph with a 2% incline today. Increased incline on TM.   Duration Continue with 30 min of aerobic exercise without signs/symptoms of physical distress. Continue with 30 min of aerobic exercise without signs/symptoms of physical distress. Continue with 30 min of aerobic exercise without signs/symptoms of physical distress. Continue with 30 min of aerobic exercise without signs/symptoms of physical distress. Continue with 30 min of aerobic exercise without signs/symptoms of physical distress.   Intensity _2      Progression   Progression Continue to progress workloads to maintain intensity without signs/symptoms of physical distress. Continue to progress workloads to maintain intensity without signs/symptoms of physical distress. Continue to progress workloads to maintain intensity without signs/symptoms of physical distress. Continue to progress workloads to maintain intensity without signs/symptoms of physical distress. Continue to progress workloads to maintain intensity without signs/symptoms of physical distress.   Average METs 3.5 4.1 4.3 5 5.2     Resistance Training   Training Prescription No Yes Yes Yes Yes   Weight -- 4 lbs 4 lbs 5 lbs 5 lbs   Reps -- 10-15 10-15 10-15 10-15   Time -- 10 Minutes 10 Minutes 10 Minutes 10 Minutes     Interval Training   Interval Training _3       Treadmill   MPH 2.5 2._4 Grade 0 0 0 2 2.5   Minutes _5 METs 2.91 3.07 3.3 4.12 4.33     Recumbant Bike   Level 3 3.5 3.5 3.5 3.5   Minutes _6 METs 4.2 5.1 5.4 5.8 6     Home Exercise Plan   Plans to continue exercise at -- Home (comment)  Walking Home (comment)  Walking Home (comment)  Walking Home (comment)  Walking   Frequency -- Add 4 additional days to program exercise sessions. Add 4 additional days to program exercise sessions. Add 4 additional days to program exercise sessions. Add 4 additional days to program exercise sessions.   Initial Home Exercises Provided -- 03/25/21 03/25/21 03/25/21 03/25/21            Exercise Comments:   Exercise Comments     Row Name 03/18/21 1049 03/25/21 1113 04/13/21 1111 04/27/21 1045     Exercise Comments Patient tolerated exercise well without symptoms. Reviewed home exercise guidelines and goals with patient. Reviewed METs and goals with patient. Patient added 2% incline to treadmill station today and tolerated well. Patient will let me know if she wants to make this a permanent change to her exercise prescription. Reviewed METs with patient. Patient increased incline to 2.5% today.             Exercise Goals and Review:   Exercise Goals     Row Name 03/10/21  1422             Exercise Goals   Increase Physical Activity Yes       Intervention Provide advice, education, support and counseling about physical activity/exercise needs.;Develop an individualized exercise prescription for aerobic and resistive training based on initial evaluation findings, risk stratification, comorbidities and participant's personal goals.       Expected Outcomes Short Term: Attend rehab on a regular basis to increase amount of physical activity.;Long Term: Exercising regularly at least 3-5 days a week.;Long Term: Add in home exercise to make exercise part of routine and to increase amount of physical activity.        Increase Strength and Stamina Yes       Intervention Provide advice, education, support and counseling about physical activity/exercise needs.;Develop an individualized exercise prescription for aerobic and resistive training based on initial evaluation findings, risk stratification, comorbidities and participant's personal goals.       Expected Outcomes Short Term: Increase workloads from initial exercise prescription for resistance, speed, and METs.;Short Term: Perform resistance training exercises routinely during rehab and add in resistance training at home;Long Term: Improve cardiorespiratory fitness, muscular endurance and strength as measured by increased METs and functional capacity (6MWT)       Able to understand and use rate of perceived exertion (RPE) scale Yes       Intervention Provide education and explanation on how to use RPE scale       Expected Outcomes Short Term: Able to use RPE daily in rehab to express subjective intensity level;Long Term:  Able to use RPE to guide intensity level when exercising independently       Knowledge and understanding of Target Heart Rate Range (THRR) Yes       Intervention Provide education and explanation of THRR including how the numbers were predicted and where they are located for reference       Expected Outcomes Short Term: Able to state/look up THRR;Long Term: Able to use THRR to govern intensity when exercising independently;Short Term: Able to use daily as guideline for intensity in rehab       Able to check pulse independently Yes       Intervention Provide education and demonstration on how to check pulse in carotid and radial arteries.;Review the importance of being able to check your own pulse for safety during independent exercise       Expected Outcomes Short Term: Able to explain why pulse checking is important during independent exercise;Long Term: Able to check pulse independently and accurately       Understanding of Exercise  Prescription Yes       Intervention Provide education, explanation, and written materials on patient's individual exercise prescription       Expected Outcomes Short Term: Able to explain program exercise prescription;Long Term: Able to explain home exercise prescription to exercise independently                Exercise Goals Re-Evaluation :  Exercise Goals Re-Evaluation     Row Name 03/18/21 1049 03/25/21 1113 04/13/21 1111         Exercise Goal Re-Evaluation   Exercise Goals Review Increase Physical Activity;Able to understand and use rate of perceived exertion (RPE) scale Increase Physical Activity;Able to understand and use rate of perceived exertion (RPE) scale;Increase Strength and Stamina;Knowledge and understanding of Target Heart Rate Range (THRR);Understanding of Exercise Prescription Increase Physical Activity;Able to understand and use rate of perceived exertion (RPE) scale;Increase Strength and Stamina;Knowledge  and understanding of Target Heart Rate Range (THRR);Understanding of Exercise Prescription     Comments Patient able to understand and use RPE scale appropriately. Reviewed exercise prescription with patient, and patient is walking 30 minutes on the days she doesn't attend cardiac rehab. Patient has reviewed the pulse counting handout, and I encouraged her to practice coutning her pulse while she's at cardiac rehab. Patient is making excellent progress with exercise at cardiac rehab. Discussed ways to increase MET average, and patient wanted to try adding incline to her treadmill (TM) station. Patient walked at 2% incline today and tolerated well. Patient is scheduled for an echocardiogram in 2 weeks to evaluate her ejection fraction(EF). Patient's goal is for EF to improve from previous check. Patient hasn't been as consistent with walking for her home exercise due to the weather but plans to rejoin fitness center where she can exercise and participate in [place].      Expected Outcomes Progress workloads as tolerated to help increase cardiorespiratory fitness. Patient will continue walking at least 30 minutes in addition to exercise at cardiac rehab to help build strength and stamina. Patient will rejoin fitness center to continue her exericse on the days she doesn't attend cardiac rehab (CR) and where she can continue to exercise once she completes the CR program.              Discharge Exercise Prescription (Final Exercise Prescription Changes):  Exercise Prescription Changes - 04/27/21 1057       Response to Exercise   Blood Pressure (Admit) 100/58    Blood Pressure (Exercise) 138/58    Blood Pressure (Exit) 95/62    Heart Rate (Admit) 64 bpm    Heart Rate (Exercise) 107 bpm    Heart Rate (Exit) 71 bpm    Rating of Perceived Exertion (Exercise) 11    Symptoms None    Comments Increased incline on TM.    Duration Continue with 30 min of aerobic exercise without signs/symptoms of physical distress.    Intensity THRR unchanged      Progression   Progression Continue to progress workloads to maintain intensity without signs/symptoms of physical distress.    Average METs 5.2      Resistance Training   Training Prescription Yes    Weight 5 lbs    Reps 10-15    Time 10 Minutes      Interval Training   Interval Training No      Treadmill   MPH 3    Grade 2.5    Minutes 15    METs 4.33      Recumbant Bike   Level 3.5    Minutes 15    METs 6      Home Exercise Plan   Plans to continue exercise at Home (comment)   Walking   Frequency Add 4 additional days to program exercise sessions.    Initial Home Exercises Provided 03/25/21             Nutrition:  Target Goals: Understanding of nutrition guidelines, daily intake of sodium <1575m, cholesterol <2084m calories 30% from fat and 7% or less from saturated fats, daily to have 5 or more servings of fruits and vegetables.  Biometrics:  Pre Biometrics - 03/10/21 1421        Pre Biometrics   Waist Circumference 37.5 inches    Hip Circumference 39.75 inches    Waist to Hip Ratio 0.94 %    Triceps Skinfold 12 mm    %  Body Fat 33.9 %    Grip Strength 28.5 kg    Flexibility 19.75 in    Single Leg Stand 2.68 seconds              Nutrition Therapy Plan and Nutrition Goals:  Nutrition Therapy & Goals - 03/30/21 1120       Nutrition Therapy   Diet TLC    Drug/Food Interactions Statins/Certain Fruits      Personal Nutrition Goals   Nutrition Goal Pt to build a healthy plate including vegetables, fruits, whole grains, and low-fat dairy products in a heart healthy meal plan.    Personal Goal #2 Pt to read labels to continue eating a low sodium diet <1500 mg/day    Personal Goal #3 Pt to incorporate 10-20 g soluble fiber daily      Intervention Plan   Intervention Nutrition handout(s) given to patient.;Prescribe, educate and counsel regarding individualized specific dietary modifications aiming towards targeted core components such as weight, hypertension, lipid management, diabetes, heart failure and other comorbidities.    Expected Outcomes Long Term Goal: Adherence to prescribed nutrition plan.;Short Term Goal: Understand basic principles of dietary content, such as calories, fat, sodium, cholesterol and nutrients.             Nutrition Assessments:  MEDIFICTS Score Key: ?70 Need to make dietary changes  40-70 Heart Healthy Diet ? 40 Therapeutic Level Cholesterol Diet   Flowsheet Row CARDIAC REHAB PHASE II EXERCISE from 03/18/2021 in [facility]  Picture Your Plate Total Score on Admission 77      Picture Your Plate Scores: <71 Unhealthy dietary pattern with much room for improvement. 41-50 Dietary pattern unlikely to meet recommendations for good health and room for improvement. 51-60 More healthful dietary pattern, with some room for improvement.  >60 Healthy dietary pattern, although there may be some  specific behaviors that could be improved.    Nutrition Goals Re-Evaluation:  Nutrition Goals Re-Evaluation     [place] Name 03/30/21 1120 04/23/21 1456           Goals   Current Weight 146 lb 2.6 oz (66.3 kg) 146 lb 2.6 oz (66.3 kg)      Nutrition Goal -- Pt to build a healthy plate including vegetables, fruits, whole grains, and low-fat dairy products in a heart healthy meal plan.             Personal Goal #2 Re-Evaluation   Personal Goal #2 -- Pt to read labels to continue eating a low sodium diet <1500 mg/day             Personal Goal #3 Re-Evaluation   Personal Goal #3 -- Pt to incorporate 10-20 g soluble fiber daily               Nutrition Goals Re-Evaluation:  Nutrition Goals Re-Evaluation     [place] Name 03/30/21 1120 04/23/21 1456           Goals   Current Weight 146 lb 2.6 oz (66.3 kg) 146 lb 2.6 oz (66.3 kg)      Nutrition Goal -- Pt to build a healthy plate including vegetables, fruits, whole grains, and low-fat dairy products in a heart healthy meal plan.             Personal Goal #2 Re-Evaluation   Personal Goal #2 -- Pt to read labels to continue eating a low sodium diet <1500 mg/day  Personal Goal #3 Re-Evaluation   Personal Goal #3 -- Pt to incorporate 10-20 g soluble fiber daily               Nutrition Goals Discharge (Final Nutrition Goals Re-Evaluation):  Nutrition Goals Re-Evaluation - 04/23/21 1456       Goals   Current Weight 146 lb 2.6 oz (66.3 kg)    Nutrition Goal Pt to build a healthy plate including vegetables, fruits, whole grains, and low-fat dairy products in a heart healthy meal plan.      Personal Goal #2 Re-Evaluation   Personal Goal #2 Pt to read labels to continue eating a low sodium diet <1500 mg/day      Personal Goal #3 Re-Evaluation   Personal Goal #3 Pt to incorporate 10-20 g soluble fiber daily             Psychosocial: Target Goals: Acknowledge presence or absence of significant depression  and/or stress, maximize coping skills, provide positive support system. Participant is able to verbalize types and ability to use techniques and skills needed for reducing stress and depression.  Initial Review & Psychosocial Screening:  Initial Psych Review & Screening - 03/10/21 1559       Initial Review   Current issues with History of Depression      Family Dynamics   Good Support System? Yes   Perlita lives alone. Annlouise has her daughter who lives in town and her elderly mother for support     Barriers   Psychosocial barriers to participate in program There are no identifiable barriers or psychosocial needs.      Screening Interventions   Interventions Encouraged to exercise             Quality of Life Scores:  Quality of Life - 03/10/21 1552       Quality of Life   Select Quality of Life      Quality of Life Scores   Health/Function Pre 23.89 %    Socioeconomic Pre 26.79 %    Psych/Spiritual Pre 24.86 %    Family Pre 30 %    GLOBAL Pre 25.5 %            Scores of 19 and below usually indicate a poorer quality of life in these areas.  A difference of  2-3 points is a clinically meaningful difference.  A difference of 2-3 points in the total score of the Quality of Life Index has been associated with significant improvement in overall quality of life, self-image, physical symptoms, and general health in studies assessing change in quality of life.  PHQ-9: Recent Review Flowsheet Data     Depression screen [facility] 2/9 03/10/2021   Decreased Interest 0   Down, Depressed, Hopeless 0   PHQ - 2 Score 0      Interpretation of Total Score  Total Score Depression Severity:  1-4 = Minimal depression, 5-9 = Mild depression, 10-14 = Moderate depression, 15-19 = Moderately severe depression, 20-27 = Severe depression   Psychosocial Evaluation and Intervention:   Psychosocial Re-Evaluation:  Psychosocial Re-Evaluation     [place] Name 03/18/21 1627 04/01/21 1209 04/23/21  1541         Psychosocial Re-Evaluation   Current issues with History of Depression History of Depression History of Depression     Comments Yaminah started exercise at cardiac rehab on 03/18/21 will continue to monitor and offer support as needed October has not voiced any increased depression or stressors since participating in phase  2 cardiac rehab Jennet continues not to voice any  increased depression or stressors since participating in phase 2 cardiac rehab     Expected Outcomes Dakia will have decreased or controlled stress upon completion of phase 2 cardiac rehab Klaire will have decreased or controlled stress upon completion of phase 2 cardiac rehab Shakora will have decreased or controlled stress upon completion of phase 2 cardiac rehab     Interventions Stress management education;Encouraged to attend Cardiac Rehabilitation for the exercise Stress management education;Encouraged to attend Cardiac Rehabilitation for the exercise Stress management education;Encouraged to attend Cardiac Rehabilitation for the exercise     Continue Psychosocial Services  Follow up required by staff No Follow up required No Follow up required              Psychosocial Discharge (Final Psychosocial Re-Evaluation):  Psychosocial Re-Evaluation - 04/23/21 1541       Psychosocial Re-Evaluation   Current issues with History of Depression    Comments Aurelia continues not to voice any  increased depression or stressors since participating in phase 2 cardiac rehab    Expected Outcomes Dene will have decreased or controlled stress upon completion of phase 2 cardiac rehab    Interventions Stress management education;Encouraged to attend Cardiac Rehabilitation for the exercise    Continue Psychosocial Services  No Follow up required             Vocational Rehabilitation: Provide vocational rehab assistance to qualifying candidates.   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 Participant will state understanding/return demonstration of topics presented.  Learning Barriers/Preferences:  Learning Barriers/Preferences - 03/10/21 1600       Learning Barriers/Preferences   Learning Barriers Sight   Has Glaucoma   Learning Preferences Skilled Demonstration;Video;Pictoral             Education Topics: Count Your Pulse:  -Group instruction provided by verbal instruction, demonstration, patient participation and written materials to support subject.  Instructors address importance of being able to find your pulse and how to count your pulse when at home without a heart monitor.  Patients get hands on experience counting their pulse with staff help and individually.   Heart Attack, Angina, and Risk Factor Modification:  -Group instruction provided by verbal instruction, video, and written materials to support subject.  Instructors address signs and symptoms of angina and heart attacks.    Also discuss risk factors for heart disease and how to make changes to improve heart health risk factors.   Functional Fitness:  -Group instruction provided by verbal instruction, demonstration, patient participation, and written materials to support subject.  Instructors address safety measures for doing things around the house.  Discuss how to get up and down off the floor, how to pick things up properly, how to safely get out of a chair without assistance, and balance training.   Meditation and Mindfulness:  -Group instruction provided by verbal instruction, patient participation, and written materials to support subject.  Instructor addresses  importance of mindfulness and meditation practice to help reduce stress and improve awareness.  Instructor also leads participants through a  meditation exercise.    Stretching for Flexibility and Mobility:  -Group instruction provided by verbal instruction, patient participation, and written materials to support subject.  Instructors lead participants through series of stretches that are designed to increase flexibility thus improving mobility.  These stretches are additional exercise for major muscle groups that are typically performed during regular warm up and cool down.   Hands Only CPR:  -Group verbal, video, and participation provides a basic overview of AHA guidelines for community CPR. Role-play of emergencies allow participants the opportunity to practice calling for help and chest compression technique with discussion of AED use.   Hypertension: -Group verbal and written instruction that provides a basic overview of hypertension including the most recent diagnostic guidelines, risk factor reduction with self-care instructions and medication management.    Nutrition I class: Heart Healthy Eating:  -Group instruction provided by PowerPoint slides, verbal discussion, and written materials to support subject matter. The instructor gives an explanation and review of the Therapeutic Lifestyle Changes diet recommendations, which includes a discussion on lipid goals, dietary fat, sodium, fiber, plant stanol/sterol esters, sugar, and the components of a well-balanced, healthy diet.   Nutrition II class: Lifestyle Skills:  -Group instruction provided by PowerPoint slides, verbal discussion, and written materials to support subject matter. The instructor gives an explanation and review of label reading, grocery shopping for heart health, heart healthy recipe modifications, and ways to make healthier choices when eating out.   Diabetes Question & Answer:  -Group instruction provided by  PowerPoint slides, verbal discussion, and written materials to support subject matter. The instructor gives an explanation and review of diabetes co-morbidities, pre- and post-prandial blood glucose goals, pre-exercise blood glucose goals, signs, symptoms, and treatment of hypoglycemia and hyperglycemia, and foot care basics.   Diabetes Blitz:  -Group instruction provided by PowerPoint slides, verbal discussion, and written materials to support subject matter. The instructor gives an explanation and review of the physiology behind type 1 and type 2 diabetes, diabetes medications and rational behind using different medications, pre- and post-prandial blood glucose recommendations and Hemoglobin A1c goals, diabetes diet, and exercise including blood glucose guidelines for exercising safely.    Portion Distortion:  -Group instruction provided by PowerPoint slides, verbal discussion, written materials, and food models to support subject matter. The instructor gives an explanation of serving size versus portion size, changes in portions sizes over the last 20 years, and what consists of a serving from each food group.   Stress Management:  -Group instruction provided by verbal instruction, video, and written materials to support subject matter.  Instructors review role of stress in heart disease and how to cope with stress positively.     Exercising on Your Own:  -Group instruction provided by verbal instruction, power point, and written materials to support subject.  Instructors discuss benefits of exercise, components of exercise, frequency and intensity of exercise, and end points for exercise.  Also discuss use of nitroglycerin and activating EMS.  Review options of places to exercise outside of rehab.  Review guidelines for sex with heart disease.   Cardiac Drugs I:  -Group instruction provided by verbal instruction and written materials to support subject.  Instructor reviews cardiac drug  classes: antiplatelets, anticoagulants, beta blockers, and statins.  Instructor discusses reasons, side effects, and lifestyle considerations for each drug class.   Cardiac Drugs II:  -Group instruction provided by verbal instruction and written materials to support subject.  Instructor reviews cardiac drug classes: angiotensin converting enzyme inhibitors (ACE-I), angiotensin  II receptor blockers (ARBs), nitrates, and calcium channel blockers.  Instructor discusses reasons, side effects, and lifestyle considerations for each drug class.   Anatomy and Physiology of the Circulatory System:  Group verbal and written instruction and models provide basic cardiac anatomy and physiology, with the coronary electrical and arterial systems. Review of: AMI, Angina, Valve disease, Heart Failure, Peripheral Artery Disease, Cardiac Arrhythmia, Pacemakers, and the ICD.   Other Education:  -Group or individual verbal, written, or video instructions that support the educational goals of the cardiac rehab program.   Holiday Eating Survival Tips:  -Group instruction provided by PowerPoint slides, verbal discussion, and written materials to support subject matter. The instructor gives patients tips, tricks, and techniques to help them not only survive but enjoy the holidays despite the onslaught of food that accompanies the holidays.   Knowledge Questionnaire Score:  Knowledge Questionnaire Score - 03/10/21 1552       Knowledge Questionnaire Score   Pre Score 24/24             Core Components/Risk Factors/Patient Goals at Admission:  Personal Goals and Risk Factors at Admission - 03/10/21 1442       Core Components/Risk Factors/Patient Goals on Admission    Weight Management Yes;Weight Maintenance    Intervention Weight Management: Provide education and appropriate resources to help participant work on and attain dietary goals.;Weight Management: Develop a combined nutrition and exercise program  designed to reach desired caloric intake, while maintaining appropriate intake of nutrient and fiber, sodium and fats, and appropriate energy expenditure required for the weight goal.    Expected Outcomes Understanding of distribution of calorie intake throughout the day with the consumption of 4-5 meals/snacks;Understanding recommendations for meals to include 15-35% energy as protein, 25-35% energy from fat, 35-60% energy from carbohydrates, less than 258m of dietary cholesterol, 20-35 gm of total fiber daily;Weight Maintenance: Understanding of the daily nutrition guidelines, which includes 25-35% calories from fat, 7% or less cal from saturated fats, less than 2056mcholesterol, less than 1.5gm of sodium, & 5 or more servings of fruits and vegetables daily    Heart Failure Yes    Intervention Provide a combined exercise and nutrition program that is supplemented with education, support and counseling about heart failure. Directed toward relieving symptoms such as shortness of breath, decreased exercise tolerance, and extremity edema.    Expected Outcomes Improve functional capacity of life;Short term: Attendance in program 2-3 days a week with increased exercise capacity. Reported lower sodium intake. Reported increased fruit and vegetable intake. Reports medication compliance.;Short term: Daily weights obtained and reported for increase. Utilizing diuretic protocols set by physician.;Long term: Adoption of self-care skills and reduction of barriers for early signs and symptoms recognition and intervention leading to self-care maintenance.    Lipids Yes    Intervention Provide education and support for participant on nutrition & aerobic/resistive exercise along with prescribed medications to achieve LDL <70101mHDL >58m54m  Expected Outcomes Short Term: Participant states understanding of desired cholesterol values and is compliant with medications prescribed. Participant is following exercise  prescription and nutrition guidelines.;Long Term: Cholesterol controlled with medications as prescribed, with individualized exercise RX and with personalized nutrition plan. Value goals: LDL < 70mg49mL > 40 mg.    Stress Yes    Intervention Offer individual and/or small group education and counseling on adjustment to heart disease, stress management and health-related lifestyle change. Teach and support self-help strategies.;Refer participants experiencing significant psychosocial distress to appropriate mental health specialists for further  evaluation and treatment. When possible, include family members and significant others in education/counseling sessions.    Expected Outcomes Short Term: Participant demonstrates changes in health-related behavior, relaxation and other stress management skills, ability to obtain effective social support, and compliance with psychotropic medications if prescribed.;Long Term: Emotional wellbeing is indicated by absence of clinically significant psychosocial distress or social isolation.    Personal Goal Other Yes    Personal Goal Improve ejection fraction/ improve heart fucntion so that she can do ADLs, be able to drink more water, be able to discontinue some medications with physician approval.    Intervention Provide a exercise routine with aerobic training, resistance training, and stretching exercises to help build cardiorespiratory fitness and improve heart function.    Expected Outcomes Adhere to consistent exercise routine to help build strength and stamina and improve functional capacity as measured by MET level and 6 MWT.             Core Components/Risk Factors/Patient Goals Review:   Goals and Risk Factor Review     Row Name 03/18/21 1630 04/01/21 1210 04/23/21 1542         Core Components/Risk Factors/Patient Goals Review   Personal Goals Review Weight Management/Obesity;Heart Failure;Lipids;Stress Weight Management/Obesity;Heart  Failure;Lipids;Stress Weight Management/Obesity;Heart Failure;Lipids;Stress     Review Tecia started exercise at cardiac rehab on 03/18/21. VSS. Chidinma did well with exercise Izamar has been doing well with exercise. Some resting systolic BP's in the 21'J noted. patient asymptomatic. Will continue to monitor BP Denali continues to do well with exercise. Velvie's remains asymptomatic. Doralyn's systolic BP remains in the 90's Dr Debara Pickett is aware     Expected Outcomes Chany will continue to participate in phase 2 cardiac rehab for exercise, nutrition and lifestyle modifications Makayli will continue to participate in phase 2 cardiac rehab for exercise, nutrition and lifestyle modifications Lanasia will continue to participate in phase 2 cardiac rehab for exercise, nutrition and lifestyle modifications              Core Components/Risk Factors/Patient Goals at Discharge (Final Review):   Goals and Risk Factor Review - 04/23/21 1542       Core Components/Risk Factors/Patient Goals Review   Personal Goals Review Weight Management/Obesity;Heart Failure;Lipids;Stress    Review Danice continues to do well with exercise. Payson's remains asymptomatic. Asal's systolic BP remains in the 90's Dr Debara Pickett is aware    Expected Outcomes Trinnity will continue to participate in phase 2 cardiac rehab for exercise, nutrition and lifestyle modifications             ITP Comments:  ITP Comments     Row Name 03/10/21 1421 03/18/21 1625 04/01/21 1208 04/23/21 1540     ITP Comments Medical Director- Dr. Fransico Him, MD 30 Day ITP Review. Arriah started exercise at cardiac rehab on 03/18/21 and did well with exercise. 30 Day ITP Review. Chiann has good attendance and partcipation in phase 2 cardiac rehab 30 Day ITP Review. Darrell continues to have  good attendance and partcipation in phase 2 cardiac rehab.             Comments: See ITP Comments

## 2021-04-28 NOTE — Progress Notes (Signed)
Congratulations, the pumping function has improved from the previous 35% in April to 60-65% which is within normal range. Mild aortic valve and mitral valve leakage not unexpected given age. Overall, excellent result

## 2021-04-29 ENCOUNTER — Other Ambulatory Visit: Payer: Self-pay

## 2021-04-29 ENCOUNTER — Encounter (HOSPITAL_COMMUNITY)
Admission: RE | Admit: 2021-04-29 | Discharge: 2021-04-29 | Disposition: A | Discharge status: Home / Self Care | Payer: Medicare PPO | Source: Ambulatory Visit | Attending: Internal Medicine | Admitting: Internal Medicine

## 2021-04-29 ENCOUNTER — Encounter (HOSPITAL_COMMUNITY): Payer: Medicare PPO

## 2021-04-29 DIAGNOSIS — Z955 Presence of coronary angioplasty implant and graft: Secondary | ICD-10-CM | POA: Diagnosis not present

## 2021-04-29 DIAGNOSIS — Z5189 Encounter for other specified aftercare: Secondary | ICD-10-CM | POA: Diagnosis not present

## 2021-04-29 DIAGNOSIS — I214 Non-ST elevation (NSTEMI) myocardial infarction: Secondary | ICD-10-CM

## 2021-04-29 DIAGNOSIS — I252 Old myocardial infarction: Secondary | ICD-10-CM | POA: Diagnosis not present

## 2021-05-01 ENCOUNTER — Encounter (HOSPITAL_COMMUNITY)
Admission: RE | Admit: 2021-05-01 | Discharge: 2021-05-01 | Disposition: A | Discharge status: Home / Self Care | Payer: Medicare PPO | Source: Ambulatory Visit | Attending: Internal Medicine | Admitting: Internal Medicine

## 2021-05-01 ENCOUNTER — Other Ambulatory Visit: Payer: Self-pay

## 2021-05-01 ENCOUNTER — Encounter (HOSPITAL_COMMUNITY): Payer: Medicare PPO

## 2021-05-01 DIAGNOSIS — I214 Non-ST elevation (NSTEMI) myocardial infarction: Secondary | ICD-10-CM

## 2021-05-01 DIAGNOSIS — I252 Old myocardial infarction: Secondary | ICD-10-CM | POA: Diagnosis not present

## 2021-05-01 DIAGNOSIS — Z955 Presence of coronary angioplasty implant and graft: Secondary | ICD-10-CM

## 2021-05-01 DIAGNOSIS — Z5189 Encounter for other specified aftercare: Secondary | ICD-10-CM | POA: Diagnosis not present

## 2021-05-04 ENCOUNTER — Encounter (HOSPITAL_COMMUNITY): Payer: Medicare PPO

## 2021-05-04 ENCOUNTER — Encounter (HOSPITAL_COMMUNITY)
Admission: RE | Admit: 2021-05-04 | Discharge: 2021-05-04 | Disposition: A | Discharge status: Home / Self Care | Payer: Medicare PPO | Source: Ambulatory Visit | Attending: Internal Medicine | Admitting: Internal Medicine

## 2021-05-04 ENCOUNTER — Other Ambulatory Visit: Payer: Self-pay

## 2021-05-04 DIAGNOSIS — Z5189 Encounter for other specified aftercare: Secondary | ICD-10-CM | POA: Diagnosis not present

## 2021-05-04 DIAGNOSIS — I214 Non-ST elevation (NSTEMI) myocardial infarction: Secondary | ICD-10-CM

## 2021-05-04 DIAGNOSIS — I252 Old myocardial infarction: Secondary | ICD-10-CM | POA: Diagnosis not present

## 2021-05-04 DIAGNOSIS — Z955 Presence of coronary angioplasty implant and graft: Secondary | ICD-10-CM

## 2021-05-06 ENCOUNTER — Encounter (HOSPITAL_COMMUNITY)
Admission: RE | Admit: 2021-05-06 | Discharge: 2021-05-06 | Disposition: A | Discharge status: Home / Self Care | Payer: Medicare PPO | Source: Ambulatory Visit | Attending: Internal Medicine | Admitting: Internal Medicine

## 2021-05-06 ENCOUNTER — Encounter (HOSPITAL_COMMUNITY): Payer: Medicare PPO

## 2021-05-06 ENCOUNTER — Other Ambulatory Visit: Payer: Self-pay

## 2021-05-06 DIAGNOSIS — Z955 Presence of coronary angioplasty implant and graft: Secondary | ICD-10-CM | POA: Diagnosis not present

## 2021-05-06 DIAGNOSIS — I214 Non-ST elevation (NSTEMI) myocardial infarction: Secondary | ICD-10-CM

## 2021-05-06 DIAGNOSIS — Z5189 Encounter for other specified aftercare: Secondary | ICD-10-CM | POA: Diagnosis not present

## 2021-05-06 DIAGNOSIS — I252 Old myocardial infarction: Secondary | ICD-10-CM | POA: Diagnosis not present

## 2021-05-06 NOTE — Progress Notes (Signed)
Discharge Progress Report  Patient Details  Name: Debbie Frazier MRN: [id number] Date of Birth: [birth year] Referring Provider:   Flowsheet Row CARDIAC REHAB PHASE II ORIENTATION from 03/10/2021 in [place]  Referring Provider Debara Pickett, Nadean Corwin, MD        Number of Visits: 16  Reason for Discharge:  Patient reached a stable level of exercise. Patient independent in their exercise. Patient has met program and personal goals.  Smoking History:  Social History   Tobacco Use  Smoking Status Never  Smokeless Tobacco Never    Diagnosis:  01/09/21 DES LAD  01/08/21 NSTEMI  ADL [facility]:   Initial Exercise Prescription:  Initial Exercise Prescription - 03/10/21 1500       Date of Initial Exercise RX and Referring Provider   Date 03/10/21    Referring Provider Pixie Casino, MD    Expected Discharge Date 05/08/21      Treadmill   MPH 3    Grade 0    Minutes 15    METs 3.3      Recumbant Bike   Level 3    Watts 35    Minutes 15    METs 3.66      Prescription Details   Frequency (times per week) 3    Duration Progress to 30 minutes of continuous aerobic without signs/symptoms of physical distress      Intensity   THRR 40-80% of Max Heartrate 62-123    Ratings of Perceived Exertion 11-13    Perceived Dyspnea 0-4      Progression   Progression Continue to progress workloads to maintain intensity without signs/symptoms of physical distress.      Resistance Training   Training Prescription Yes    Weight 3 lbs    Reps 10-15             Discharge Exercise Prescription (Final Exercise Prescription Changes):  Exercise Prescription Changes - 05/08/21 1052       Response to Exercise   Blood Pressure (Admit) 98/58    Blood Pressure (Exercise) 138/52    Blood Pressure (Exit) 91/59    Heart Rate (Admit) 80 bpm    Heart Rate (Exercise) 104 bpm    Heart Rate (Exit) 80 bpm    Rating of Perceived Exertion (Exercise) 11     Symptoms None    Duration Continue with 30 min of aerobic exercise without signs/symptoms of physical distress.    Intensity THRR unchanged      Progression   Progression Continue to progress workloads to maintain intensity without signs/symptoms of physical distress.    Average METs 5.1      Resistance Training   Training Prescription Yes    Weight 5 lbs    Reps 10-15    Time 10 Minutes      Interval Training   Interval Training No      Treadmill   MPH 3    Grade 2.5    Minutes 15    METs 4.33      Recumbant Bike   Level 3.5    Minutes 15    METs 5.9      Home Exercise Plan   Plans to continue exercise at Home (comment)   Walking   Frequency Add 4 additional days to program exercise sessions.    Initial Home Exercises Provided 03/25/21             Functional Capacity:  6 Minute Walk  [place] Name 03/10/21 1502 05/04/21 1046       6 Minute Walk   Phase Initial Discharge    Distance 1613 feet 1806 feet    Distance % Change -- 11.97 %    Distance Feet Change -- 193 ft    Walk Time 6 minutes 6 minutes    # of Rest Breaks 0 0    MPH 3.05 3.42    METS 3.67 3.9    RPE 12 11    Perceived Dyspnea  1 0    VO2 Peak 12.86 13.66    Symptoms Yes (comment) No    Comments Mild SOB --    Resting HR 76 bpm 73 bpm    Resting BP 114/68 102/52    Resting Oxygen Saturation  99 % --    Exercise Oxygen Saturation  during 6 min walk 96 % --    Max Ex. HR 101 bpm 102 bpm    Max Ex. BP 120/70 102/50    2 Minute Post BP 128/62 94/62             Psychological, QOL, Others - Outcomes: PHQ 2/9: Depression screen [facility] 2/9 05/06/2021 05/06/2021 03/10/2021  Decreased Interest 0 0 0  Down, Depressed, Hopeless 0 0 0  PHQ - 2 Score 0 0 0    Quality of Life:  Quality of Life - 05/01/21 1045       Quality of Life   Select Quality of Life      Quality of Life Scores   Health/Function Pre 23.89 %    Health/Function Post 26.1 %    Health/Function % Change 9.25 %     Socioeconomic Pre 26.79 %    Socioeconomic Post 26.5 %    Socioeconomic % Change  -1.08 %    Psych/Spiritual Pre 24.86 %    Psych/Spiritual Post 25.71 %    Psych/Spiritual % Change 3.42 %    Family Pre 30 %    Family Post 28.5 %    Family % Change -5 %    GLOBAL Pre 25.5 %    GLOBAL Post 26.39 %    GLOBAL % Change 3.49 %             Personal Goals: Goals established at orientation with interventions provided to work toward goal.  Personal Goals and Risk Factors at Admission - 03/10/21 1442       Core Components/Risk Factors/Patient Goals on Admission    Weight Management Yes;Weight Maintenance    Intervention Weight Management: Provide education and appropriate resources to help participant work on and attain dietary goals.;Weight Management: Develop a combined nutrition and exercise program designed to reach desired caloric intake, while maintaining appropriate intake of nutrient and fiber, sodium and fats, and appropriate energy expenditure required for the weight goal.    Expected Outcomes Understanding of distribution of calorie intake throughout the day with the consumption of 4-5 meals/snacks;Understanding recommendations for meals to include 15-35% energy as protein, 25-35% energy from fat, 35-60% energy from carbohydrates, less than 279m of dietary cholesterol, 20-35 gm of total fiber daily;Weight Maintenance: Understanding of the daily nutrition guidelines, which includes 25-35% calories from fat, 7% or less cal from saturated fats, less than 2064mcholesterol, less than 1.5gm of sodium, & 5 or more servings of fruits and vegetables daily    Heart Failure Yes    Intervention Provide a combined exercise and nutrition program that is supplemented with education, support and counseling about heart failure. Directed toward  relieving symptoms such as shortness of breath, decreased exercise tolerance, and extremity edema.    Expected Outcomes Improve functional capacity of  life;Short term: Attendance in program 2-3 days a week with increased exercise capacity. Reported lower sodium intake. Reported increased fruit and vegetable intake. Reports medication compliance.;Short term: Daily weights obtained and reported for increase. Utilizing diuretic protocols set by physician.;Long term: Adoption of self-care skills and reduction of barriers for early signs and symptoms recognition and intervention leading to self-care maintenance.    Lipids Yes    Intervention Provide education and support for participant on nutrition & aerobic/resistive exercise along with prescribed medications to achieve LDL <50m, HDL >459m    Expected Outcomes Short Term: Participant states understanding of desired cholesterol values and is compliant with medications prescribed. Participant is following exercise prescription and nutrition guidelines.;Long Term: Cholesterol controlled with medications as prescribed, with individualized exercise RX and with personalized nutrition plan. Value goals: LDL < 7068mHDL > 40 mg.    Stress Yes    Intervention Offer individual and/or small group education and counseling on adjustment to heart disease, stress management and health-related lifestyle change. Teach and support self-help strategies.;Refer participants experiencing significant psychosocial distress to appropriate mental health specialists for further evaluation and treatment. When possible, include family members and significant others in education/counseling sessions.    Expected Outcomes Short Term: Participant demonstrates changes in health-related behavior, relaxation and other stress management skills, ability to obtain effective social support, and compliance with psychotropic medications if prescribed.;Long Term: Emotional wellbeing is indicated by absence of clinically significant psychosocial distress or social isolation.    Personal Goal Other Yes    Personal Goal Improve ejection fraction/  improve heart fucntion so that she can do ADLs, be able to drink more water, be able to discontinue some medications with physician approval.    Intervention Provide a exercise routine with aerobic training, resistance training, and stretching exercises to help build cardiorespiratory fitness and improve heart function.    Expected Outcomes Adhere to consistent exercise routine to help build strength and stamina and improve functional capacity as measured by MET level and 6 MWT.              Personal Goals Discharge:  Goals and Risk Factor Review     Row Name 03/18/21 1630 04/01/21 1210 04/23/21 1542 05/06/21 1105       Core Components/Risk Factors/Patient Goals Review   Personal Goals Review Weight Management/Obesity;Heart Failure;Lipids;Stress Weight Management/Obesity;Heart Failure;Lipids;Stress Weight Management/Obesity;Heart Failure;Lipids;Stress Weight Management/Obesity;Heart Failure;Lipids;Stress    Review LauMarvelarted exercise at cardiac rehab on 03/18/21. VSS. LauLadelled well with exercise LauMichaelahs been doing well with exercise. Some resting systolic BP's in the 90'19'Qted. patient asymptomatic. Will continue to monitor BP LauJerahntinues to do well with exercise. Odessie's remains asymptomatic. Jniyah's systolic BP remains in the 90's Dr HilDebara Pickett aware LauWannettantinues to do well with exercise. Pascale's remains asymptomatic. Lucinda's systolic BP remains in the 90's Dr HilDebara Pickett aware. LauHonestll complete phase 2 cardiac rehab on 05/08/21.    Expected Outcomes LauEstellarll continue to participate in phase 2 cardiac rehab for exercise, nutrition and lifestyle modifications LauRoganll continue to participate in phase 2 cardiac rehab for exercise, nutrition and lifestyle modifications LauLinzeell continue to participate in phase 2 cardiac rehab for exercise, nutrition and lifestyle modifications LauDevaehll continue to participate in phase 2 cardiac rehab for exercise, nutrition and  lifestyle modifications  Exercise Goals and Review:  Exercise Goals     Row Name 03/10/21 1422             Exercise Goals   Increase Physical Activity Yes       Intervention Provide advice, education, support and counseling about physical activity/exercise needs.;Develop an individualized exercise prescription for aerobic and resistive training based on initial evaluation findings, risk stratification, comorbidities and participant's personal goals.       Expected Outcomes Short Term: Attend rehab on a regular basis to increase amount of physical activity.;Long Term: Exercising regularly at least 3-5 days a week.;Long Term: Add in home exercise to make exercise part of routine and to increase amount of physical activity.       Increase Strength and Stamina Yes       Intervention Provide advice, education, support and counseling about physical activity/exercise needs.;Develop an individualized exercise prescription for aerobic and resistive training based on initial evaluation findings, risk stratification, comorbidities and participant's personal goals.       Expected Outcomes Short Term: Increase workloads from initial exercise prescription for resistance, speed, and METs.;Short Term: Perform resistance training exercises routinely during rehab and add in resistance training at home;Long Term: Improve cardiorespiratory fitness, muscular endurance and strength as measured by increased METs and functional capacity (6MWT)       Able to understand and use rate of perceived exertion (RPE) scale Yes       Intervention Provide education and explanation on how to use RPE scale       Expected Outcomes Short Term: Able to use RPE daily in rehab to express subjective intensity level;Long Term:  Able to use RPE to guide intensity level when exercising independently       Knowledge and understanding of Target Heart Rate Range (THRR) Yes       Intervention Provide education and explanation  of THRR including how the numbers were predicted and where they are located for reference       Expected Outcomes Short Term: Able to state/look up THRR;Long Term: Able to use THRR to govern intensity when exercising independently;Short Term: Able to use daily as guideline for intensity in rehab       Able to check pulse independently Yes       Intervention Provide education and demonstration on how to check pulse in carotid and radial arteries.;Review the importance of being able to check your own pulse for safety during independent exercise       Expected Outcomes Short Term: Able to explain why pulse checking is important during independent exercise;Long Term: Able to check pulse independently and accurately       Understanding of Exercise Prescription Yes       Intervention Provide education, explanation, and written materials on patient's individual exercise prescription       Expected Outcomes Short Term: Able to explain program exercise prescription;Long Term: Able to explain home exercise prescription to exercise independently                Exercise Goals Re-Evaluation:  Exercise Goals Re-Evaluation     Row Name 03/18/21 1049 03/25/21 1113 04/13/21 1111 05/04/21 1106 05/08/21 1146     Exercise Goal Re-Evaluation   Exercise Goals Review Increase Physical Activity;Able to understand and use rate of perceived exertion (RPE) scale Increase Physical Activity;Able to understand and use rate of perceived exertion (RPE) scale;Increase Strength and Stamina;Knowledge and understanding of Target Heart Rate Range (THRR);Understanding of Exercise Prescription Increase Physical Activity;Able to  understand and use rate of perceived exertion (RPE) scale;Increase Strength and Stamina;Knowledge and understanding of Target Heart Rate Range (THRR);Understanding of Exercise Prescription Increase Physical Activity;Able to understand and use rate of perceived exertion (RPE) scale;Increase Strength and  Stamina;Knowledge and understanding of Target Heart Rate Range (THRR);Understanding of Exercise Prescription Increase Physical Activity;Able to understand and use rate of perceived exertion (RPE) scale;Increase Strength and Stamina;Knowledge and understanding of Target Heart Rate Range (THRR);Understanding of Exercise Prescription   Comments Patient able to understand and use RPE scale appropriately. Reviewed exercise prescription with patient, and patient is walking 30 minutes on the days she doesn't attend cardiac rehab. Patient has reviewed the pulse counting handout, and I encouraged her to practice coutning her pulse while she's at cardiac rehab. Patient is making excellent progress with exercise at cardiac rehab. Discussed ways to increase MET average, and patient wanted to try adding incline to her treadmill (TM) station. Patient walked at 2% incline today and tolerated well. Patient is scheduled for an echocardiogram in 2 weeks to evaluate her ejection fraction(EF). Patient's goal is for EF to improve from previous check. Patient hasn't been as consistent with walking for her home exercise due to the weather but plans to rejoin fitness center where she can exercise and participate in [place]. Patient scheduled to complete the cardiac rehab program on Friday 05/08/21. Patient's functional capacity increased 12% as measured by 6MWT. Patient feels that her upper body and arm strength have icreased. Patient plans to continue walking in her neighborhood and riding her stationary bike at home. Patient has [employer] that she she can watch on her TV and is going to [employer] for her strength training and TM use. Patient has 3, 5, and 10 lb weights at home that she can use when she doesn't go to the gym. Patient plans to exercise at least 30 minutes, 5 days/week to maintain health and fitness gains. Patient completed the cardiac rehab program and progressed well, achieving 5.2 METs with  exercise. Patient is very pleased with her progress and is happy that she achieved her personal goals.   Expected Outcomes Progress workloads as tolerated to help increase cardiorespiratory fitness. Patient will continue walking at least 30 minutes in addition to exercise at cardiac rehab to help build strength and stamina. Patient will rejoin fitness center to continue her exericse on the days she doesn't attend cardiac rehab (CR) and where she can continue to exercise once she completes the CR program. Patient will continue exericse at least 30 minutes, 5 days/week to maintain health and fitness gains. Patient will continue exericse at least 30 minutes, 5 days/week to maintain health and fitness gains.            Nutrition & Weight - Outcomes:  Pre Biometrics - 03/10/21 1421       Pre Biometrics   Waist Circumference 37.5 inches    Hip Circumference 39.75 inches    Waist to Hip Ratio 0.94 %    Triceps Skinfold 12 mm    % Body Fat 33.9 %    Grip Strength 28.5 kg    Flexibility 19.75 in    Single Leg Stand 2.68 seconds             Post Biometrics - 05/08/21 1051        Post  Biometrics   Height 5' 4.5" (1.638 m)    Waist Circumference 36.25 inches    Hip Circumference 39 inches    Waist to Hip  Ratio 0.93 %    BMI (Calculated) 24.26    Triceps Skinfold 11 mm    % Body Fat 32.8 %    Grip Strength 21.5 kg   *Different dynamometer used pre vs post.   Flexibility 20 in    Single Leg Stand 4.2 seconds             Nutrition:  Nutrition Therapy & Goals - 03/30/21 1120       Nutrition Therapy   Diet TLC    Drug/Food Interactions Statins/Certain Fruits      Personal Nutrition Goals   Nutrition Goal Pt to build a healthy plate including vegetables, fruits, whole grains, and low-fat dairy products in a heart healthy meal plan.    Personal Goal #2 Pt to read labels to continue eating a low sodium diet <1500 mg/day    Personal Goal #3 Pt to incorporate 10-20 g soluble  fiber daily      Intervention Plan   Intervention Nutrition handout(s) given to patient.;Prescribe, educate and counsel regarding individualized specific dietary modifications aiming towards targeted core components such as weight, hypertension, lipid management, diabetes, heart failure and other comorbidities.    Expected Outcomes Long Term Goal: Adherence to prescribed nutrition plan.;Short Term Goal: Understand basic principles of dietary content, such as calories, fat, sodium, cholesterol and nutrients.             Nutrition Discharge:   Education Questionnaire Score:  Knowledge Questionnaire Score - 05/01/21 1045       Knowledge Questionnaire Score   Pre Score 24/24    Post Score 24/24             Goals reviewed with patient; copy given to patient.Pt graduated from cardiac rehab program on 05/08/21 with completion of  exercise sessions in Phase II. Pt maintained good attendance and progressed nicely during her participation in rehab as evidenced by increased MET level.   Medication list reconciled. Repeat  PHQ score- 0 .  Pt has made significant lifestyle changes and should be commended for her success. Pt feels he has achieved his goals during cardiac rehab.   Pt plans to continue exercise by walking in her neighborhood, riding her stationary bike and going to [employer]. Kaydince increased her distance on her post exercise walk test by 189 feet. We are proud of Byrdie's progress! Barnet Pall, RN,BSN 05/19/2021 11:17 AM

## 2021-05-08 ENCOUNTER — Encounter (HOSPITAL_COMMUNITY): Payer: Medicare PPO

## 2021-05-08 ENCOUNTER — Encounter (HOSPITAL_COMMUNITY)
Admission: RE | Admit: 2021-05-08 | Discharge: 2021-05-08 | Disposition: A | Discharge status: Home / Self Care | Payer: Medicare PPO | Source: Ambulatory Visit | Attending: Internal Medicine | Admitting: Internal Medicine

## 2021-05-08 ENCOUNTER — Other Ambulatory Visit: Payer: Self-pay

## 2021-05-08 VITALS — BP 98/50 | HR 80 | Ht 64.5 in | Wt 143.5 lb

## 2021-05-08 DIAGNOSIS — Z5189 Encounter for other specified aftercare: Secondary | ICD-10-CM | POA: Diagnosis not present

## 2021-05-08 DIAGNOSIS — I214 Non-ST elevation (NSTEMI) myocardial infarction: Secondary | ICD-10-CM

## 2021-05-08 DIAGNOSIS — I252 Old myocardial infarction: Secondary | ICD-10-CM | POA: Diagnosis not present

## 2021-05-08 DIAGNOSIS — Z955 Presence of coronary angioplasty implant and graft: Secondary | ICD-10-CM

## 2021-05-11 ENCOUNTER — Encounter (HOSPITAL_COMMUNITY): Payer: Medicare PPO

## 2021-05-13 ENCOUNTER — Encounter (HOSPITAL_COMMUNITY): Payer: Medicare PPO

## 2021-05-14 ENCOUNTER — Ambulatory Visit: Payer: Medicare PPO | Admitting: Internal Medicine

## 2021-05-14 ENCOUNTER — Other Ambulatory Visit: Payer: Self-pay

## 2021-05-14 ENCOUNTER — Encounter: Payer: Self-pay | Admitting: Internal Medicine

## 2021-05-14 VITALS — BP 104/60 | HR 71 | Ht 64.5 in | Wt 144.4 lb

## 2021-05-14 DIAGNOSIS — G4733 Obstructive sleep apnea (adult) (pediatric): Secondary | ICD-10-CM | POA: Diagnosis not present

## 2021-05-14 DIAGNOSIS — Z9989 Dependence on other enabling machines and devices: Secondary | ICD-10-CM

## 2021-05-14 DIAGNOSIS — I2510 Atherosclerotic heart disease of native coronary artery without angina pectoris: Secondary | ICD-10-CM | POA: Diagnosis not present

## 2021-05-14 DIAGNOSIS — Z9861 Coronary angioplasty status: Secondary | ICD-10-CM | POA: Diagnosis not present

## 2021-05-14 DIAGNOSIS — I25119 Atherosclerotic heart disease of native coronary artery with unspecified angina pectoris: Secondary | ICD-10-CM | POA: Diagnosis not present

## 2021-05-14 DIAGNOSIS — E785 Hyperlipidemia, unspecified: Secondary | ICD-10-CM

## 2021-05-14 DIAGNOSIS — I255 Ischemic cardiomyopathy: Secondary | ICD-10-CM

## 2021-05-14 NOTE — Patient Instructions (Signed)
Medication Instructions:  NO CHANGES *If you need a refill on your cardiac medications before your next appointment, please call your pharmacy*   Follow-Up: At [facility], you and your health needs are our priority.  As part of our continuing mission to provide you with exceptional heart care, we have created designated Provider Care Teams.  These Care Teams include your primary Cardiologist (physician) and Advanced Practice Providers (APPs -  Physician Assistants and Nurse Practitioners) who all work together to provide you with the care you need, when you need it.  We recommend signing up for the patient portal called "MyChart".  Sign up information is provided on this After Visit Summary.  MyChart is used to connect with patients for Virtual Visits (Telemedicine).  Patients are able to view lab/test results, encounter notes, upcoming appointments, etc.  Non-urgent messages can be sent to your provider as well.   To learn more about what you can do with MyChart, go to [email or web address].    Your next appointment:   April 2023  The format for your next appointment:   In Person  Provider:   You may see Zoila Shutter MD or one of the following Advanced Practice Providers on your designated Care Team:   Azalee Course, PA-C Micah Flesher, [place] or  Judy Pimple, [place]   Other Instructions

## 2021-05-14 NOTE — Progress Notes (Signed)
OFFICE NOTE  Chief Complaint:  Follow-up   Primary Care Physician: Sigmund Hazel, MD  HPI:  Debbie Frazier is a 65 y.o. female with a past medial history significant for CAD s/p NSTEMI in April 2022.  Cath showed 75% proximal LAD and 99% mid LAD status post PCI.  There was 40% ostial circumflex disease as well.  Heart function at the time was reduced with EF 35 to 40%.  She also has a history of OSA on CPAP, depression, dyslipidemia and prior moderate alcohol use.  Since then she has felt much better.  She participated in cardiac rehab.  She is noted marked improvement in her symptoms.  A repeat echo was just performed which showed LVEF now up to 60 to 65% with mild aortic insufficiency.  She is completely asymptomatic.  She is having no issues with significant bruising or bleeding on aspirin and Brilinta.  She is scheduled for repeat CPAP titration next week.  Lipids are very well controlled with total cholesterol in July of 103, HDL 38, LDL 49 and triglycerides 75 on 80 mg atorvastatin.  She is also on aspirin, carvedilol and losartan.  Brilinta 90 mg twice daily.  Blood pressure was 104/60 today.  At times it runs lower however she is asymptomatic with this.  She is also managed to cut back her alcohol use significantly almost completely out which I think is also been beneficial for her.  PMHx:  Past Medical History:  Diagnosis Date   Coronary Artery Disease 01/10/2021   NSTEMI 12/2020 - Cath: pLAD 75, mLAD 99 >> s/p DES; oLCx 40   Depression    HFrEF (heart failure with reduced ejection fraction)    Ischemic CM // Echo 4/22: apical / apical ant / apical septal AK; EF 35-40, mild asymmetric LVH, Gr 2 DD, normal RVSF, mild LAE, trivial MR, mild AI   High cholesterol    Hypothyroidism    Non-ST elevated myocardial infarction 01/08/2021   12/2020 - S/p 3 x 28 mm Synergy DES to prox-mid LAD   OSA on CPAP     Past Surgical History:  Procedure Laterality Date   ARTHROPLASTY     CARDIAC  CATHETERIZATION     CARPAL TUNNEL RELEASE Right    CORONARY STENT INTERVENTION N/A 01/09/2021   Procedure: CORONARY STENT INTERVENTION;  Surgeon: Corky Crafts, MD;  Location: [facility] INVASIVE CV LAB;  Service: Cardiovascular;  Laterality: N/A;   EYE SURGERY     INTRAVASCULAR IMAGING/OCT N/A 01/09/2021   Procedure: INTRAVASCULAR IMAGING/OCT;  Surgeon: Corky Crafts, MD;  Location: [facility] INVASIVE CV LAB;  Service: Cardiovascular;  Laterality: N/A;   JOINT REPLACEMENT     LEFT HEART CATH AND CORONARY ANGIOGRAPHY N/A 01/09/2021   Procedure: LEFT HEART CATH AND CORONARY ANGIOGRAPHY;  Surgeon: Corky Crafts, MD;  Location: [facility] INVASIVE CV LAB;  Service: Cardiovascular;  Laterality: N/A;   SEPTOPLASTY      FAMHx:  Family History  Problem Relation Age of Onset   CAD Father    CAD Maternal Grandfather     SOCHx:   reports that she has never smoked. She has never used smokeless tobacco. She reports that she does not currently use alcohol after a past usage of about 12.0 standard drinks per week. She reports that she does not use drugs.  ALLERGIES:  Allergies  Allergen Reactions   Brimonidine Other (See Comments)    Pt was not able to tolerate     ROS: Pertinent items noted  in HPI and remainder of comprehensive ROS otherwise negative.  HOME MEDS: Current Outpatient Medications on File Prior to Visit  Medication Sig Dispense Refill   acyclovir (ZOVIRAX) 200 MG capsule Take 20 mg by mouth as needed (flare).     aspirin EC 81 MG EC tablet Take 1 tablet (81 mg total) by mouth daily. Swallow whole. 30 tablet 11   atorvastatin (LIPITOR) 80 MG tablet Take 1 tablet (80 mg total) by mouth daily. 30 tablet 11   carvedilol (COREG) 3.125 MG tablet Take 1 tablet (3.125 mg total) by mouth 2 (two) times daily with a meal. 60 tablet 11   clobetasol (TEMOVATE) 0.05 % external solution Apply 1 application topically as needed (scalp).     fluorometholone (FML) 0.1 % ophthalmic suspension  Place 1 drop into both eyes as needed (irritation).     hydrOXYzine (ATARAX/VISTARIL) 25 MG tablet Take 25 mg by mouth as needed for anxiety.     losartan (COZAAR) 25 MG tablet Take 1 tablet (25 mg total) by mouth daily. 30 tablet 11   sertraline (ZOLOFT) 100 MG tablet Take 100 mg by mouth daily. Take 1.5 Tablets at Bedtime     SYNTHROID 150 MCG tablet Take 150 mcg by mouth at bedtime.     ticagrelor (BRILINTA) 90 MG TABS tablet Take 1 tablet (90 mg total) by mouth 2 (two) times daily. 180 tablet 3   timolol (TIMOPTIC) 0.5 % ophthalmic solution Place 1 drop into the left eye 2 (two) times daily.     nitroGLYCERIN (NITROSTAT) 0.4 MG SL tablet Place 1 tablet (0.4 mg total) under the tongue every 5 (five) minutes x 3 doses as needed for chest pain. 25 tablet 11   No current facility-administered medications on file prior to visit.    LABS/IMAGING: No results found for this or any previous visit (from the past 48 hour(s)). No results found.  LIPID PANEL:    Component Value Date/Time   CHOL 103 03/24/2021 0000   TRIG 75 03/24/2021 0000   HDL 38 (L) 03/24/2021 0000   CHOLHDL 2.7 03/24/2021 0000   CHOLHDL 3.8 01/09/2021 0228   VLDL 34 01/09/2021 0228   LDLCALC 49 03/24/2021 0000     WEIGHTS: Wt Readings from Last 3 Encounters:  05/14/21 144 lb 6.4 oz (65.5 kg)  05/08/21 143 lb 8.3 oz (65.1 kg)  03/30/21 146 lb 2.6 oz (66.3 kg)    VITALS: BP 104/60 (BP Location: Left Arm, Patient Position: Sitting, Cuff Size: Normal)   Pulse 71   Ht 5' 4.5" (1.638 m)   Wt 144 lb 6.4 oz (65.5 kg)   SpO2 99%   BMI 24.40 kg/m   EXAM: General appearance: alert and no distress Neck: no carotid bruit, no JVD, and thyroid not enlarged, symmetric, no tenderness/mass/nodules Lungs: clear to auscultation bilaterally Heart: regular rate and rhythm, S1, S2 normal, no murmur, click, rub or gallop Abdomen: soft, non-tender; bowel sounds normal; no masses,  no organomegaly Extremities: extremities normal,  atraumatic, no cyanosis or edema Pulses: 2+ and symmetric Skin: Skin color, texture, turgor normal. No rashes or lesions Neurologic: Grossly normal Pleasant  EKG: Deferred  ASSESSMENT: CAD with NSTEMI, status post PCI to the mid LAD and some moderate residual disease of the ostial circumflex.  Ischemic cardiomyopathy LVEF 35 to 40%-now improved to 60 to 65% with mild AI (04/2021) Mixed dyslipidemia OSA on CPAP  PLAN: 1.   Overall Ms. Cambre is doing very well.  She completed cardiac rehabilitation.  Her  LVEF has normalized.  She does have some mild aortic insufficiency which we will follow.  Cholesterol is at goal.  Blood pressure is normal to low normal but she is asymptomatic.  She is going to have a repeat sleep study to see if she needs additional titration of her CPAP.  She did have some moderate residual coronary disease but is asymptomatic with this.  Overall she is doing great.  We will plan to see her back in April which will be 1 year since her PCI.  At that time we can likely discontinue her Brilinta.  Chrystie Nose, MD, [facility], FACP  [place]  [facility]  Medical Director of the [facility]  [facility] Diplomate of the American Board of Clinical Lipidology Attending Cardiologist  Direct Dial: [phone]  Fax: [phone]  Website:  [email or web address] Nicely Jaymere Alen 05/14/2021, 10:15 AM

## 2021-05-15 ENCOUNTER — Encounter (HOSPITAL_COMMUNITY): Payer: Medicare PPO

## 2021-05-20 ENCOUNTER — Encounter (HOSPITAL_COMMUNITY): Payer: Medicare PPO

## 2021-05-21 ENCOUNTER — Ambulatory Visit (HOSPITAL_BASED_OUTPATIENT_CLINIC_OR_DEPARTMENT_OTHER): Payer: Medicare PPO | Attending: Sleep Medicine | Admitting: Sleep Medicine

## 2021-05-21 ENCOUNTER — Other Ambulatory Visit: Payer: Self-pay

## 2021-05-21 DIAGNOSIS — G4733 Obstructive sleep apnea (adult) (pediatric): Secondary | ICD-10-CM | POA: Insufficient documentation

## 2021-05-22 ENCOUNTER — Encounter (HOSPITAL_COMMUNITY): Payer: Medicare PPO

## 2021-05-25 ENCOUNTER — Encounter (HOSPITAL_COMMUNITY): Payer: Medicare PPO

## 2021-05-27 ENCOUNTER — Encounter (HOSPITAL_COMMUNITY): Payer: Medicare PPO

## 2021-05-28 DIAGNOSIS — I10 Essential (primary) hypertension: Secondary | ICD-10-CM | POA: Diagnosis not present

## 2021-05-28 DIAGNOSIS — G4733 Obstructive sleep apnea (adult) (pediatric): Secondary | ICD-10-CM | POA: Diagnosis not present

## 2021-05-28 DIAGNOSIS — E039 Hypothyroidism, unspecified: Secondary | ICD-10-CM | POA: Diagnosis not present

## 2021-05-29 ENCOUNTER — Encounter (HOSPITAL_COMMUNITY): Payer: Medicare PPO

## 2021-06-01 NOTE — Procedures (Signed)
   NAME: Debbie Frazier DATE OF BIRTH:  Jan 17, 1956 MEDICAL RECORD NUMBER [id number]  LOCATION: [facility]  PHYSICIAN: Janney Priego D Antonino Nienhuis  DATE OF STUDY: 05/21/2021  SLEEP STUDY TYPE: Positive Airway Pressure Titration               REFERRING PHYSICIAN: Alanda Slim, MD  CLINICAL INFORMATION Debbie Frazier is a 65 year old Female and was referred to the sleep center for CPAP titration study.   MEDICATIONS Patient self administered medications include: Lipitor, Hydroxyzine, Zoloft, Synthroid, Brilinta, Timolol. Sleep medications administered during study include - Hydroxyzine 25 mg at 09:30:34 PM  SLEEP STUDY TECHNIQUE The patient underwent an attended overnight polysomnography titration to assess the effects of CPAP therapy. The following variables were monitored: EEG(C4-A1, C3-A2, O1-A2, O2-A1), EOG, submental and leg EMG, ECG, oxyhemoglobin saturation by pulse oximetry, thoracic and abdominal respiratory effort belts, nasal/oral airflow by pressure sensor, body position sensor and snoring sensor. CPAP pressure was titrated to eliminate apneas, hypopneas and oxygen desaturation. Hypopneas were scored per AASM definition IB (4% desaturation)  TECHNICIAN COMMENTS Comments added by Technician: Patient had difficulty initiating sleep. Patient was restless all through the night. SLEEP ARCHITECTURE The study was initiated at 10:38:22 PM and terminated at 4:31:35 AM. Total recorded time was 353.2 minutes. EEG confirmed total sleep time was 232.5 minutes yielding a sleep efficiency of 65.8%%. Sleep onset after lights out was 47.5 minutes with a REM latency of 63.5 minutes. The patient spent 9.7%% of the night in stage N1 sleep, 77.2%% in stage N2 sleep, 11.4%% in stage N3 and 1.7% in REM. The Arousal Index was 28.6/hour. RESPIRATORY PARAMETERS The overall AHI was 0.3 per hour, and the RDI was 2.8 events/hour with a central apnea index of 0 per hour. The most appropriate  setting of CPAP was 8 cm H2O. At this setting, the sleep efficiency was 62% and the patient was supine for 100%. The AHI was 0 events per hour, and the RDI was 0 events/hour (with 0 central events) and the arousal index was 52.6 per hour.The oxygen nadir was 93.0% during sleep.    The cumulative time under 88% oxygen saturation was 5.5 minutes  LEG MOVEMENT DATA The total leg movements were 2 with a resulting leg movement index of 0.5/hr. Associated arousal with leg movement index was 0.0/hr. CARDIAC DATA The underlying cardiac rhythm was most consistent with sinus rhythm. Mean heart rate during sleep was 57.1 bpm. Additional rhythm abnormalities include None. IMPRESSIONS - Obstructive Sleep apnea (OSA), optimal pressure attained. DIAGNOSIS - Obstructive Sleep Apnea (G47.33) RECOMMENDATIONS - Trial of CPAP therapy on 8 cm H2O with a Small size [employer] Respironics Nasal Pillow Mask Dreamwear Under Nose Frame (S) mask and heated humidification. - Avoid alcohol, sedatives and other CNS depressants that may worsen sleep apnea and disrupt normal sleep architecture. - Sleep hygiene should be reviewed to assess factors that may improve sleep quality. - Weight management and regular exercise should be continued.  Abbagayle Zaragoza D Dustan Hyams Diplomate, American Board of Internal Medicine  ELECTRONICALLY SIGNED ON:  06/01/2021, 10:49 AM [facility] PH: [phone]   FX: [phone] ACCREDITED BY THE AMERICAN ACADEMY OF SLEEP MEDICINE

## 2021-06-09 DIAGNOSIS — Z95820 Peripheral vascular angioplasty status with implants and grafts: Secondary | ICD-10-CM | POA: Diagnosis not present

## 2021-06-09 DIAGNOSIS — E039 Hypothyroidism, unspecified: Secondary | ICD-10-CM | POA: Diagnosis not present

## 2021-06-09 DIAGNOSIS — G4733 Obstructive sleep apnea (adult) (pediatric): Secondary | ICD-10-CM | POA: Diagnosis not present

## 2021-06-09 DIAGNOSIS — Z0000 Encounter for general adult medical examination without abnormal findings: Secondary | ICD-10-CM | POA: Diagnosis not present

## 2021-06-09 DIAGNOSIS — Z23 Encounter for immunization: Secondary | ICD-10-CM | POA: Diagnosis not present

## 2021-06-09 DIAGNOSIS — F411 Generalized anxiety disorder: Secondary | ICD-10-CM | POA: Diagnosis not present

## 2021-06-09 DIAGNOSIS — Z1159 Encounter for screening for other viral diseases: Secondary | ICD-10-CM | POA: Diagnosis not present

## 2021-06-09 DIAGNOSIS — I10 Essential (primary) hypertension: Secondary | ICD-10-CM | POA: Diagnosis not present

## 2021-06-09 DIAGNOSIS — I252 Old myocardial infarction: Secondary | ICD-10-CM | POA: Diagnosis not present

## 2021-06-10 ENCOUNTER — Other Ambulatory Visit: Payer: Self-pay | Admitting: Family Medicine

## 2021-06-10 DIAGNOSIS — E2839 Other primary ovarian failure: Secondary | ICD-10-CM

## 2021-06-18 ENCOUNTER — Other Ambulatory Visit: Payer: Self-pay | Admitting: Family Medicine

## 2021-06-18 DIAGNOSIS — E2839 Other primary ovarian failure: Secondary | ICD-10-CM

## 2021-07-02 ENCOUNTER — Ambulatory Visit
Admission: RE | Admit: 2021-07-02 | Discharge: 2021-07-02 | Disposition: A | Discharge status: Home / Self Care | Payer: Medicare PPO | Source: Ambulatory Visit | Attending: Family Medicine | Admitting: Family Medicine

## 2021-07-02 ENCOUNTER — Other Ambulatory Visit: Payer: Self-pay

## 2021-07-02 DIAGNOSIS — E2839 Other primary ovarian failure: Secondary | ICD-10-CM

## 2021-07-02 DIAGNOSIS — Z780 Asymptomatic menopausal state: Secondary | ICD-10-CM | POA: Diagnosis not present

## 2021-07-02 DIAGNOSIS — M85832 Other specified disorders of bone density and structure, left forearm: Secondary | ICD-10-CM | POA: Diagnosis not present

## 2021-09-25 DIAGNOSIS — H401233 Low-tension glaucoma, bilateral, severe stage: Secondary | ICD-10-CM | POA: Diagnosis not present

## 2021-11-12 DIAGNOSIS — E039 Hypothyroidism, unspecified: Secondary | ICD-10-CM | POA: Diagnosis not present

## 2021-11-12 DIAGNOSIS — I10 Essential (primary) hypertension: Secondary | ICD-10-CM | POA: Diagnosis not present

## 2021-11-12 DIAGNOSIS — G4733 Obstructive sleep apnea (adult) (pediatric): Secondary | ICD-10-CM | POA: Diagnosis not present

## 2021-12-16 DIAGNOSIS — M79671 Pain in right foot: Secondary | ICD-10-CM | POA: Diagnosis not present

## 2021-12-16 DIAGNOSIS — M7742 Metatarsalgia, left foot: Secondary | ICD-10-CM | POA: Diagnosis not present

## 2021-12-16 DIAGNOSIS — M79672 Pain in left foot: Secondary | ICD-10-CM | POA: Diagnosis not present

## 2022-01-03 NOTE — Progress Notes (Signed)
? ?Cardiology Clinic Note  ? ?Patient Name: Debbie DerrickLaurie E Frazier ?Date of Encounter: 01/03/2022 ? ?Primary Care Provider:  Sigmund HazelMiller, Lisa, MD ?Primary Cardiologist:  Chrystie NoseKenneth C Hilty, MD ? ?Patient Profile  ?  ?Debbie DerrickLaurie E Frazier 66 year old female presents to the clinic today for follow-up evaluation of her coronary artery disease status post percutaneous coronary angioplasty. ? ?Past Medical History  ?  ?Past Medical History:  ?Diagnosis Date  ? Coronary Artery Disease 01/10/2021  ? NSTEMI 12/2020 - Cath: pLAD 75, mLAD 99 >> s/p DES; oLCx 40  ? Depression   ? HFrEF (heart failure with reduced ejection fraction)   ? Ischemic CM // Echo 4/22: apical / apical ant / apical septal AK; EF 35-40, mild asymmetric LVH, Gr 2 DD, normal RVSF, mild LAE, trivial MR, mild AI  ? High cholesterol   ? Hypothyroidism   ? Non-ST elevated myocardial infarction 01/08/2021  ? 12/2020 - S/p 3 x 28 mm Synergy DES to prox-mid LAD  ? OSA on CPAP   ? ?Past Surgical History:  ?Procedure Laterality Date  ? ARTHROPLASTY    ? CARDIAC CATHETERIZATION    ? CARPAL TUNNEL RELEASE Right   ? CORONARY STENT INTERVENTION N/A 01/09/2021  ? Procedure: CORONARY STENT INTERVENTION;  Surgeon: Corky CraftsVaranasi, Jayadeep S, MD;  [facility];  Service: Cardiovascular;  Laterality: N/A;  ? EYE SURGERY    ? INTRAVASCULAR IMAGING/OCT N/A 01/09/2021  ? Procedure: INTRAVASCULAR IMAGING/OCT;  Surgeon: Corky CraftsVaranasi, Jayadeep S, MD;  [facility];  Service: Cardiovascular;  Laterality: N/A;  ? JOINT REPLACEMENT    ? LEFT HEART CATH AND CORONARY ANGIOGRAPHY N/A 01/09/2021  ? Procedure: LEFT HEART CATH AND CORONARY ANGIOGRAPHY;  Surgeon: Corky CraftsVaranasi, Jayadeep S, MD;  [facility];  Service: Cardiovascular;  Laterality: N/A;  ? SEPTOPLASTY    ? ? ?Allergies ? ?Allergies  ?Allergen Reactions  ? Brimonidine Other (See Comments)  ?  Pt was not able to tolerate   ? ? ?History of Present Illness  ?  ?Debbie DerrickLaurie E Frazier has a PMH of coronary artery  disease, acute systolic CHF, ischemic cardiomyopathy, and hyperlipidemia.  She sustained an NSTEMI in April 2022.  Her cardiac catheterization showed 75% proximal LAD, 90% mid LAD status post PCI.  She was also noted to have 40% ostial circumflex.  LVEF was 35-40%.  Her PMH also includes OSA on CPAP, depression, and moderate alcohol use. ? ?She was seen in follow-up by Dr. Rennis GoldenHilty on 05/14/2021.  During that time she reported feeling much better.  She had participated in cardiac rehab.  Her follow-up echo showed an LVEF of 60 to 65% with mild aortic insufficiency.  She was completely asymptomatic.  She was not having bleeding or bruising issues and reported compliance on aspirin and Brilinta.  She was scheduled for CPAP titration follow-up 1 week.  Her cholesterol 7/22 showed an LDL of 49.  Her blood pressure was 104/60.  She was asymptomatic with her blood pressure being lower at times.  She reported cutting back on EtOH.  She had almost cut out EtOH entirely. ? ?She presents to the clinic today for follow-up evaluation and states she feels well.  She enjoys walking her dog, using her recumbent bike, and ballroom dancing.  She has been dancing 1 day a week and rides her bike 3 times per week.  She continues to follow a heart healthy low-sodium diet.  She does notice a occasional ache in her chest that lasts for  1-2 hours and dissipates without intervention.  She denies chest discomfort with exertion.  She reports that her anginal equivalent is shortness of breath.  She has not had any progressive shortness of breath at this time.  We reviewed her previous angiography and stenting.  She expressed understanding.  I will have her stop her Brilinta on 01/09/2022 and repeat her fasting lipids 7/23.  We will plan follow-up in 1 year. ? ?Today she denies chest pain, shortness of breath, lower extremity edema, fatigue, palpitations, melena, hematuria, hemoptysis, diaphoresis, weakness, presyncope, syncope, orthopnea, and  PND. ? ? ?Home Medications  ?  ?Prior to Admission medications   ?Medication Sig Start Date End Date Taking? Authorizing Provider  ?acyclovir (ZOVIRAX) 200 MG capsule Take 20 mg by mouth as needed (flare).    [provider]  ?aspirin EC 81 MG EC tablet Take 1 tablet (81 mg total) by mouth daily. Swallow whole. 01/11/21 01/11/22  Tereso Newcomer T, PA-C  ?atorvastatin (LIPITOR) 80 MG tablet Take 1 tablet (80 mg total) by mouth daily. 01/11/21 01/11/22  Tereso Newcomer T, PA-C  ?carvedilol (COREG) 3.125 MG tablet Take 1 tablet (3.125 mg total) by mouth 2 (two) times daily with a meal. 01/10/21 01/10/22  Tereso Newcomer T, PA-C  ?clobetasol (TEMOVATE) 0.05 % external solution Apply 1 application topically as needed (scalp). 07/30/20   [provider]  ?fluorometholone (FML) 0.1 % ophthalmic suspension Place 1 drop into both eyes as needed (irritation). 07/30/20   [provider]  ?hydrOXYzine (ATARAX/VISTARIL) 25 MG tablet Take 25 mg by mouth as needed for anxiety. 07/29/20   [provider]  ?losartan (COZAAR) 25 MG tablet Take 1 tablet (25 mg total) by mouth daily. 01/10/21 01/10/22  Tereso Newcomer T, PA-C  ?nitroGLYCERIN (NITROSTAT) 0.4 MG SL tablet Place 1 tablet (0.4 mg total) under the tongue every 5 (five) minutes x 3 doses as needed for chest pain. 01/10/21 01/10/22  Tereso Newcomer T, PA-C  ?sertraline (ZOLOFT) 100 MG tablet Take 100 mg by mouth daily. Take 1.5 Tablets at Bedtime    [provider]  ?SYNTHROID 150 MCG tablet Take 150 mcg by mouth at bedtime. 11/10/20   [provider]  ?ticagrelor (BRILINTA) 90 MG TABS tablet Take 1 tablet (90 mg total) by mouth 2 (two) times daily. 01/10/21 01/10/22  Tereso Newcomer T, PA-C  ?timolol (TIMOPTIC) 0.5 % ophthalmic solution Place 1 drop into the left eye 2 (two) times daily. 11/10/20   [provider]  ? ? ?Family History  ?  ?Family History  ?Problem Relation Age of Onset  ? CAD Father   ? CAD Maternal Grandfather   ? ?She  indicated that her mother is alive. She indicated that the status of her father is unknown. She indicated that the status of her maternal grandfather is unknown. ? ?Social History  ?  ?Social History  ? ?Socioeconomic History  ? Marital status: Divorced  ?  Spouse name: Not on file  ? Number of children: Not on file  ? Years of education: 57  ? Highest education level: Master's degree (e.g., MA, MS, MEng, MEd, MSW, MBA)  ?Occupational History  ? Occupation: Retired  ?Tobacco Use  ? Smoking status: Never  ? Smokeless tobacco: Never  ?Vaping Use  ? Vaping Use: Never used  ?Substance and Sexual Activity  ? Alcohol use: Not Currently  ?  Alcohol/week: 12.0 standard drinks  ?  Types: 12 Cans of beer per week  ? Drug use: Never  ?  Sexual activity: Not Currently  ?Other Topics Concern  ? Not on file  ?Social History Narrative  ? Not on file  ? ?Social Determinants of Health  ? ?Financial Resource Strain: Not on file  ?Food Insecurity: Not on file  ?Transportation Needs: Not on file  ?Physical Activity: Not on file  ?Stress: Not on file  ?Social Connections: Not on file  ?Intimate Partner Violence: Not on file  ?  ? ?Review of Systems  ?  ?General:  No chills, fever, night sweats or weight changes.  ?Cardiovascular:  No chest pain, dyspnea on exertion, edema, orthopnea, palpitations, paroxysmal nocturnal dyspnea. ?Dermatological: No rash, lesions/masses ?Respiratory: No cough, dyspnea ?Urologic: No hematuria, dysuria ?Abdominal:   No nausea, vomiting, diarrhea, bright red blood per rectum, melena, or hematemesis ?Neurologic:  No visual changes, wkns, changes in mental status. ?All other systems reviewed and are otherwise negative except as noted above. ? ?Physical Exam  ?  ?VS:  There were no vitals taken for this visit. , BMI There is no height or weight on file to calculate BMI. ?GEN: Well nourished, well developed, in no acute distress. ?HEENT: normal. ?Neck: Supple, no JVD, carotid bruits, or masses. ?Cardiac: RRR, no  murmurs, rubs, or gallops. No clubbing, cyanosis, edema.  Radials/DP/PT 2+ and equal bilaterally.  ?Respiratory:  Respirations regular and unlabored, clear to auscultation bilaterally. ?GI: Soft, nontender, nondi

## 2022-01-05 ENCOUNTER — Ambulatory Visit: Payer: Medicare PPO | Admitting: General Practice

## 2022-01-05 ENCOUNTER — Encounter: Payer: Self-pay | Admitting: General Practice

## 2022-01-05 VITALS — BP 102/64 | HR 56 | Ht 64.0 in | Wt 150.4 lb

## 2022-01-05 DIAGNOSIS — I2510 Atherosclerotic heart disease of native coronary artery without angina pectoris: Secondary | ICD-10-CM | POA: Diagnosis not present

## 2022-01-05 DIAGNOSIS — Z9861 Coronary angioplasty status: Secondary | ICD-10-CM | POA: Diagnosis not present

## 2022-01-05 DIAGNOSIS — E785 Hyperlipidemia, unspecified: Secondary | ICD-10-CM | POA: Diagnosis not present

## 2022-01-05 DIAGNOSIS — I25119 Atherosclerotic heart disease of native coronary artery with unspecified angina pectoris: Secondary | ICD-10-CM

## 2022-01-05 DIAGNOSIS — I255 Ischemic cardiomyopathy: Secondary | ICD-10-CM

## 2022-01-05 NOTE — Patient Instructions (Signed)
Medication Instructions:  ?OK TO STOP BRILINTA 01-09-2022 ? ?*If you need a refill on your cardiac medications before your next appointment, please call your pharmacy* ? ?Lab Work:    ?FASTING LIPID AND LFT IN July 2023     ? ?If you have labs (blood work) drawn today and your tests are completely normal, you will receive your results only by: ?MyChart Message (if you have MyChart) OR  A paper copy in the mail ?If you have any lab test that is abnormal or we need to change your treatment, we will call you to review the results. ? ?Special Instructions ?PLEASE READ AND FOLLOW HEART HEALTHY DIET-ATTACHED ? ?PLEASE CONTINUE PHYSICAL ACTIVITY AS TOLERATED  ? ?Follow-Up: ?Your next appointment:  12 month(s) In Person with Chrystie NoseKenneth C Hilty, MD  ? ?Please call our office 2 months in advance to schedule this appointment  :1 ? ?At [facility], you and your health needs are our priority.  As part of our continuing mission to provide you with exceptional heart care, we have created designated Provider Care Teams.  These Care Teams include your primary Cardiologist (physician) and Advanced Practice Providers (APPs -  Physician Assistants and Nurse Practitioners) who all work together to provide you with the care you need, when you need it. ? ? ?Important Information About Sugar ? ? ? ? ? ? ?        6 SALTY THINGS TO AVOID     1,800MG  DAILY ? ? ? ? ?Heart-Healthy Eating Plan ?Heart-healthy meal planning includes: ?Eating less unhealthy fats. ?Eating more healthy fats. ?Making other changes in your diet. ?Talk with your doctor or a diet specialist (dietitian) to create an eating plan that is right for you. ?What is my plan? ?What are tips for following this plan? ?Cooking ?Avoid frying your food. Try to bake, boil, grill, or broil it instead. You can also reduce fat by: ?Removing the skin from poultry. ?Removing all visible fats from meats. ?Steaming vegetables in water or broth. ?Meal planning ? ?At meals, divide your plate  into four equal parts: ?Fill one-half of your plate with vegetables and green salads. ?Fill one-fourth of your plate with whole grains. ?Fill one-fourth of your plate with lean protein foods. ?Eat 4-5 servings of vegetables per day. A serving of vegetables is: ?1 cup of raw or cooked vegetables. ?2 cups of raw leafy greens. ?Eat 4-5 servings of fruit per day. A serving of fruit is: ?1 medium whole fruit. ?? cup of dried fruit. ?? cup of fresh, frozen, or canned fruit. ?? cup of 100% fruit juice. ?Eat more foods that have soluble fiber. These are apples, broccoli, carrots, beans, peas, and barley. Try to get 20-30 g of fiber per day. ?Eat 4-5 servings of nuts, legumes, and seeds per week: ?1 serving of dried beans or legumes equals ? cup after being cooked. ?1 serving of nuts is ? cup. ?1 serving of seeds equals 1 tablespoon. ?General information ?Eat more home-cooked food. Eat less restaurant, buffet, and fast food. ?Limit or avoid alcohol. ?Limit foods that are high in starch and sugar. ?Avoid fried foods. ?Lose weight if you are overweight. ?Keep track of how much salt (sodium) you eat. This is important if you have high blood pressure. Ask your doctor to tell you more about this. ?Try to add vegetarian meals each week. ?Fats ?Choose healthy fats. These include olive oil and canola oil, flaxseeds, walnuts, almonds, and seeds. ?Eat more omega-3 fats. These include salmon, mackerel,  sardines, tuna, flaxseed oil, and ground flaxseeds. Try to eat fish at least 2 times each week. ?Check food labels. Avoid foods with trans fats or high amounts of saturated fat. ?Limit saturated fats. ?These are often found in animal products, such as meats, butter, and cream. ?These are also found in plant foods, such as palm oil, palm kernel oil, and coconut oil. ?Avoid foods with partially hydrogenated oils in them. These have trans fats. Examples are stick margarine, some tub margarines, cookies, crackers, and other baked  goods. ?What foods can I eat? ?Fruits ?All fresh, canned (in natural juice), or frozen fruits. ?Vegetables ?Fresh or frozen vegetables (raw, steamed, roasted, or grilled). Green salads. ?Grains ?Most grains. Choose whole wheat and whole grains most of the time. Rice and pasta, including brown rice and pastas made with whole wheat. ?Meats and other proteins ?Lean, well-trimmed beef, veal, pork, and lamb. Chicken and [place] without skin. All fish and shellfish. Wild duck, rabbit, pheasant, and venison. Egg whites or low-cholesterol egg substitutes. Dried beans, peas, lentils, and tofu. Seeds and most nuts. ?Dairy ?Low-fat or nonfat cheeses, including ricotta and mozzarella. Skim or 1% milk that is liquid, powdered, or evaporated. Buttermilk that is made with low-fat milk. Nonfat or low-fat yogurt. ?Fats and oils ?Non-hydrogenated (trans-free) margarines. Vegetable oils, including soybean, sesame, sunflower, olive, peanut, safflower, corn, canola, and cottonseed. Salad dressings or mayonnaise made with a vegetable oil. ?Beverages ?Mineral water. Coffee and tea. Diet carbonated beverages. ?Sweets and desserts ?Sherbet, gelatin, and fruit ice. Small amounts of dark chocolate. ?Limit all sweets and desserts. ?Seasonings and condiments ?All seasonings and condiments. ?The items listed above may not be a complete list of foods and drinks you can eat. Contact a dietitian for more options. ?What foods should I avoid? ?Fruits ?Canned fruit in heavy syrup. Fruit in cream or butter sauce. Fried fruit. Limit coconut. ?Vegetables ?Vegetables cooked in cheese, cream, or butter sauce. Fried vegetables. ?Grains ?Breads that are made with saturated or trans fats, oils, or whole milk. Croissants. Sweet rolls. Donuts. High-fat crackers, such as cheese crackers. ?Meats and other proteins ?Fatty meats, such as hot dogs, ribs, sausage, bacon, rib-eye roast or steak. High-fat deli meats, such as salami and bologna. Caviar. Domestic duck and  goose. Organ meats, such as liver. ?Dairy ?Cream, sour cream, cream cheese, and creamed cottage cheese. Whole-milk cheeses. Whole or 2% milk that is liquid, evaporated, or condensed. Whole buttermilk. Cream sauce or high-fat cheese sauce. Yogurt that is made from whole milk. ?Fats and oils ?Meat fat, or shortening. Cocoa butter, hydrogenated oils, palm oil, coconut oil, palm kernel oil. Solid fats and shortenings, including bacon fat, salt pork, lard, and butter. Nondairy cream substitutes. Salad dressings with cheese or sour cream. ?Beverages ?Regular sodas and juice drinks with added sugar. ?Sweets and desserts ?Frosting. Pudding. Cookies. Cakes. Pies. Milk chocolate or white chocolate. Buttered syrups. Full-fat ice cream or ice cream drinks. ?The items listed above may not be a complete list of foods and drinks to avoid. Contact a dietitian for more information. ?Summary ?Heart-healthy meal planning includes eating less unhealthy fats, eating more healthy fats, and making other changes in your diet. ?Eat a balanced diet. This includes fruits and vegetables, low-fat or nonfat dairy, lean protein, nuts and legumes, whole grains, and heart-healthy oils and fats. ?This information is not intended to replace advice given to you by your health care provider. Make sure you discuss any questions you have with your health care provider. ?Document Revised: 01/08/2021 Document Reviewed:  01/08/2021 ?Elsevier Patient Education ? 2022 Elsevier Inc. ? ? ?

## 2022-01-06 NOTE — Telephone Encounter (Signed)
Please contact Ms. Parekh and let her know about we would still like her to use Tylenol as needed for minor aches and pains.  Her aspirin will be continued so we would like her not to take ibuprofen. ? ?Thank you, ? ?Jossie Ng. Inez Stantz NP-C ? ?  ?01/06/2022, 12:10 PM ?[facility] ?[address] ?Office [phone] Fax [phone] ? ?

## 2022-01-07 MED ORDER — CARVEDILOL 3.125 MG PO TABS: 3.1250 mg | ORAL_TABLET | Freq: Two times a day (BID) | ORAL | 3 refills | Status: DC

## 2022-01-07 MED ORDER — LOSARTAN POTASSIUM 25 MG PO TABS: 25.0000 mg | ORAL_TABLET | Freq: Every day | ORAL | 3 refills | Status: DC

## 2022-01-07 MED ORDER — ATORVASTATIN CALCIUM 80 MG PO TABS: 80.0000 mg | ORAL_TABLET | Freq: Every day | ORAL | 3 refills | Status: DC

## 2022-01-07 NOTE — Telephone Encounter (Signed)
Called spoke to patient. She prefer to have a 90 day supply instead of 30 day. ? ?Prescription sent to pharmacy as requested. ? ?Patient verbalized understanding ?

## 2022-01-15 DIAGNOSIS — G5762 Lesion of plantar nerve, left lower limb: Secondary | ICD-10-CM | POA: Diagnosis not present

## 2022-01-15 DIAGNOSIS — M7742 Metatarsalgia, left foot: Secondary | ICD-10-CM | POA: Diagnosis not present

## 2022-02-12 DIAGNOSIS — H524 Presbyopia: Secondary | ICD-10-CM | POA: Diagnosis not present

## 2022-02-12 DIAGNOSIS — H52203 Unspecified astigmatism, bilateral: Secondary | ICD-10-CM | POA: Diagnosis not present

## 2022-02-12 DIAGNOSIS — H5213 Myopia, bilateral: Secondary | ICD-10-CM | POA: Diagnosis not present

## 2022-02-12 DIAGNOSIS — H401233 Low-tension glaucoma, bilateral, severe stage: Secondary | ICD-10-CM | POA: Diagnosis not present

## 2022-04-08 DIAGNOSIS — I2510 Atherosclerotic heart disease of native coronary artery without angina pectoris: Secondary | ICD-10-CM | POA: Diagnosis not present

## 2022-04-08 DIAGNOSIS — I255 Ischemic cardiomyopathy: Secondary | ICD-10-CM | POA: Diagnosis not present

## 2022-04-08 DIAGNOSIS — I25119 Atherosclerotic heart disease of native coronary artery with unspecified angina pectoris: Secondary | ICD-10-CM | POA: Diagnosis not present

## 2022-04-08 DIAGNOSIS — Z9861 Coronary angioplasty status: Secondary | ICD-10-CM | POA: Diagnosis not present

## 2022-04-08 DIAGNOSIS — E785 Hyperlipidemia, unspecified: Secondary | ICD-10-CM | POA: Diagnosis not present

## 2022-04-08 LAB — LIPID PANEL
Chol/HDL Ratio: 2.8 ratio (ref 0.0–4.4)
Cholesterol, Total: 133 mg/dL (ref 100–199)
HDL: 48 mg/dL (ref >39)
LDL Chol Calc (NIH): 64 mg/dL (ref 0–99)
Triglycerides: 115 mg/dL (ref 0–149)
VLDL Cholesterol Cal: 21 mg/dL (ref 5–40)

## 2022-04-08 LAB — HEPATIC FUNCTION PANEL
ALT: 24 IU/L (ref 0–32)
AST: 24 IU/L (ref 0–40)
Albumin: 4.4 g/dL (ref 3.9–4.9)
Alkaline Phosphatase: 79 IU/L (ref 44–121)
Bilirubin Total: 0.8 mg/dL (ref 0.0–1.2)
Bilirubin, Direct: 0.21 mg/dL (ref 0.00–0.40)
Total Protein: 6.4 g/dL (ref 6.0–8.5)

## 2022-04-09 ENCOUNTER — Telehealth: Payer: Self-pay | Admitting: General Practice

## 2022-04-09 NOTE — Telephone Encounter (Signed)
Patient returning call for lab results. She states she will be tied up until after 1:30 pm.

## 2022-04-09 NOTE — Telephone Encounter (Signed)
Patient given lab result per J. Molli Hazard, FNP.Marland KitchenMarland Kitchen"Her LDL has increased to 64.  Please verify that she continues to take her atorvastatin and follow a low-cholesterol diet.  Please provide her with the heart healthy high-fiber diet information.  Her previous LDL was at goal and noted to be 49.  Please refill her atorvastatin which she is run out.  Thank you."  Patient stated she hasn't followed diet for the past 2-3 weeks. She is exercising and staying hydrated. Heart healthy high fiber diet sent to patient's portal.

## 2022-04-14 DIAGNOSIS — R61 Generalized hyperhidrosis: Secondary | ICD-10-CM | POA: Diagnosis not present

## 2022-04-14 DIAGNOSIS — E039 Hypothyroidism, unspecified: Secondary | ICD-10-CM | POA: Diagnosis not present

## 2022-04-14 DIAGNOSIS — E559 Vitamin D deficiency, unspecified: Secondary | ICD-10-CM | POA: Diagnosis not present

## 2022-04-26 DIAGNOSIS — G4733 Obstructive sleep apnea (adult) (pediatric): Secondary | ICD-10-CM | POA: Diagnosis not present

## 2022-04-26 DIAGNOSIS — I10 Essential (primary) hypertension: Secondary | ICD-10-CM | POA: Diagnosis not present

## 2022-04-26 DIAGNOSIS — E039 Hypothyroidism, unspecified: Secondary | ICD-10-CM | POA: Diagnosis not present

## 2022-06-11 DIAGNOSIS — R61 Generalized hyperhidrosis: Secondary | ICD-10-CM | POA: Diagnosis not present

## 2022-06-11 DIAGNOSIS — E039 Hypothyroidism, unspecified: Secondary | ICD-10-CM | POA: Diagnosis not present

## 2022-06-11 DIAGNOSIS — F3340 Major depressive disorder, recurrent, in remission, unspecified: Secondary | ICD-10-CM | POA: Diagnosis not present

## 2022-06-11 DIAGNOSIS — Z6825 Body mass index (BMI) 25.0-25.9, adult: Secondary | ICD-10-CM | POA: Diagnosis not present

## 2022-06-11 DIAGNOSIS — Z0000 Encounter for general adult medical examination without abnormal findings: Secondary | ICD-10-CM | POA: Diagnosis not present

## 2022-06-11 DIAGNOSIS — Z1389 Encounter for screening for other disorder: Secondary | ICD-10-CM | POA: Diagnosis not present

## 2022-06-22 DIAGNOSIS — H401233 Low-tension glaucoma, bilateral, severe stage: Secondary | ICD-10-CM | POA: Diagnosis not present

## 2022-06-22 DIAGNOSIS — H35413 Lattice degeneration of retina, bilateral: Secondary | ICD-10-CM | POA: Diagnosis not present

## 2022-07-27 DIAGNOSIS — G4733 Obstructive sleep apnea (adult) (pediatric): Secondary | ICD-10-CM | POA: Diagnosis not present

## 2022-07-27 DIAGNOSIS — I10 Essential (primary) hypertension: Secondary | ICD-10-CM | POA: Diagnosis not present

## 2022-07-27 DIAGNOSIS — E039 Hypothyroidism, unspecified: Secondary | ICD-10-CM | POA: Diagnosis not present

## 2022-09-28 DIAGNOSIS — G4733 Obstructive sleep apnea (adult) (pediatric): Secondary | ICD-10-CM | POA: Diagnosis not present

## 2022-10-06 DIAGNOSIS — I5021 Acute systolic (congestive) heart failure: Secondary | ICD-10-CM | POA: Diagnosis not present

## 2022-10-06 DIAGNOSIS — I2510 Atherosclerotic heart disease of native coronary artery without angina pectoris: Secondary | ICD-10-CM | POA: Diagnosis not present

## 2022-10-06 DIAGNOSIS — Z1211 Encounter for screening for malignant neoplasm of colon: Secondary | ICD-10-CM | POA: Diagnosis not present

## 2022-10-06 DIAGNOSIS — Z7901 Long term (current) use of anticoagulants: Secondary | ICD-10-CM | POA: Diagnosis not present

## 2022-10-12 ENCOUNTER — Telehealth: Payer: Self-pay

## 2022-10-12 NOTE — Telephone Encounter (Signed)
..  Pre-operative Risk Assessment    Patient Name: Debbie Frazier  DOB: 28-Nov-1955 MRN: [id number]      Request for Surgical Clearance    Procedure:   COLONOSCOPY  Date of Surgery:  Clearance TBD       POSSIBLE IN 12/2022                          Surgeon:  [facility] Group or Practice Name:  [facility] Phone number:  [phone] Fax number:  [phone]   Type of Clearance Requested:   - Medical    Type of Anesthesia:   PROPOFOL   Additional requests/questions:    Gwenlyn Found   10/12/2022, 9:22 AM

## 2022-10-12 NOTE — Telephone Encounter (Signed)
Primary Cardiologist:Kenneth C Hilty, MD  Chart reviewed as part of pre-operative protocol coverage. Because of Debbie Frazier's past medical history and time since last visit, he/she will require a follow-up visit in order to better assess preoperative cardiovascular risk.  Pre-op covering staff: - Patient has an appointment for annual follow-up with Doreene Adas, PA on 01/06/23. If procedure is scheduled prior to this date, she will need a virtual visit or will need office visit moved up to accommodate procedure date. - Please contact requesting surgeon's office via preferred method (i.e, phone, fax) to inform them of need for appointment prior to surgery.  Patient is prescribed aspirin for history of CAD with PCI/DES 3.0 x 28 mm covering entire diseased LAD segment from ostial to mid. There is no request to stop aspirin for the procedure, however should this be requested to be held for colonoscopy, will need to be addressed by primary cardiologist due to stent length.  Debbie Life, NP-C  10/12/2022, 9:29 AM [address] Office [phone] Fax [phone]

## 2022-10-12 NOTE — Telephone Encounter (Signed)
I s/w the pt and she would like to move her appt up sooner if she could. Pt has been scheduled to see Coletta Memos, FNP 11/22/22 @ 1:55. Pt is looking at having her colonoscopy sometime in April 2024. I assured the pt that I will update the requesting office the pt has in office appt 11/22/22 for pre op clearance.

## 2022-10-22 DIAGNOSIS — H401233 Low-tension glaucoma, bilateral, severe stage: Secondary | ICD-10-CM | POA: Diagnosis not present

## 2022-11-01 DIAGNOSIS — E039 Hypothyroidism, unspecified: Secondary | ICD-10-CM | POA: Diagnosis not present

## 2022-11-01 DIAGNOSIS — G4733 Obstructive sleep apnea (adult) (pediatric): Secondary | ICD-10-CM | POA: Diagnosis not present

## 2022-11-01 DIAGNOSIS — I10 Essential (primary) hypertension: Secondary | ICD-10-CM | POA: Diagnosis not present

## 2022-11-08 DIAGNOSIS — I5021 Acute systolic (congestive) heart failure: Secondary | ICD-10-CM

## 2022-11-08 DIAGNOSIS — E785 Hyperlipidemia, unspecified: Secondary | ICD-10-CM

## 2022-11-12 DIAGNOSIS — I5021 Acute systolic (congestive) heart failure: Secondary | ICD-10-CM | POA: Diagnosis not present

## 2022-11-12 DIAGNOSIS — E785 Hyperlipidemia, unspecified: Secondary | ICD-10-CM | POA: Diagnosis not present

## 2022-11-13 LAB — HEPATIC FUNCTION PANEL
ALT: 29 IU/L (ref 0–32)
AST: 29 IU/L (ref 0–40)
Albumin: 4.9 g/dL (ref 3.9–4.9)
Alkaline Phosphatase: 83 IU/L (ref 44–121)
Bilirubin Total: 0.9 mg/dL (ref 0.0–1.2)
Bilirubin, Direct: 0.22 mg/dL (ref 0.00–0.40)
Total Protein: 7.2 g/dL (ref 6.0–8.5)

## 2022-11-13 LAB — LIPID PANEL
Chol/HDL Ratio: 3.2 ratio (ref 0.0–4.4)
Cholesterol, Total: 131 mg/dL (ref 100–199)
HDL: 41 mg/dL (ref >39)
LDL Chol Calc (NIH): 70 mg/dL (ref 0–99)
Triglycerides: 112 mg/dL (ref 0–149)
VLDL Cholesterol Cal: 20 mg/dL (ref 5–40)

## 2022-11-13 LAB — BASIC METABOLIC PANEL
BUN/Creatinine Ratio: 15 (ref 12–28)
BUN: 14 mg/dL (ref 8–27)
CO2: 24 mmol/L (ref 20–29)
Calcium: 9.9 mg/dL (ref 8.7–10.3)
Chloride: 102 mmol/L (ref 96–106)
Creatinine, Ser: 0.92 mg/dL (ref 0.57–1.00)
Glucose: 93 mg/dL (ref 70–99)
Potassium: 4.7 mmol/L (ref 3.5–5.2)
Sodium: 144 mmol/L (ref 134–144)
eGFR: 68 mL/min/{1.73_m2} (ref >59)

## 2022-11-13 LAB — CBC
Hematocrit: 45.9 % (ref 34.0–46.6)
Hemoglobin: 15.4 g/dL (ref 11.1–15.9)
MCH: 31 pg (ref 26.6–33.0)
MCHC: 33.6 g/dL (ref 31.5–35.7)
MCV: 93 fL (ref 79–97)
Platelets: 183 10*3/uL (ref 150–450)
RBC: 4.96 x10E6/uL (ref 3.77–5.28)
RDW: 12.7 % (ref 11.7–15.4)
WBC: 6.9 10*3/uL (ref 3.4–10.8)

## 2022-11-21 NOTE — Progress Notes (Signed)
 " Cardiology Office Note:    Date:  11/22/2022   ID:  MEE Debbie Frazier, DOB 1956-07-20, MRN [id number]  PCP:  Cleotilde Planas, MD   [facility] Providers Cardiologist:  Vinie JAYSON Maxcy, MD     Referring MD: Cleotilde Planas, MD   CC: yearly follow up, preoperative clearance  History of Present Illness:    Debbie Frazier is a 67 y.o. female with a hx of CAD, HFrEF, hyperlipidemia, hypothyroidism, OSA on CPAP.  She had a non-STEMI in April 2022, left heart cath showed 75% proximal LAD, 90% mid LAD, DES was placed covering the entire diseased LAD segment from ostial to mid.  Echo at this time revealed an EF of 35 to 40%, LVH of the basal septal segment, grade 2 diastolic dysfunction.  Echo on 04/27/2021 showed an EF of 60 to 65%, trivial MR, mild AR.  Most recently Ms. Debbie Frazier was evaluated by Josefa Beauvais, NP on 01/05/2022, at that time she was doing well from a cardiac perspective.  She presents today for follow up of her CAD. She has been doing well from a cardiac perspective. She continues to work out 2-3 days/week with ballroom dancing and recumbent bike exercise. She did notice her most recent LDL was elevated to 70 and this was concerning to her. She had gained ~ 6 lbs over the last year. She also endorses she has been more social and has been having social drinks two times/week. She denies chest pain, palpitations, dyspnea, pnd, orthopnea, n, v, dizziness, syncope, edema, weight gain, or early satiety.    Past Medical History:  Diagnosis Date   Coronary Artery Disease 01/10/2021   NSTEMI 12/2020 - Cath: pLAD 75, mLAD 99 >> s/p DES; oLCx 40   Depression    HFrEF (heart failure with reduced ejection fraction)    Ischemic CM // Echo 4/22: apical / apical ant / apical septal AK; EF 35-40, mild asymmetric LVH, Gr 2 DD, normal RVSF, mild LAE, trivial MR, mild AI   High cholesterol    Hypothyroidism    Non-ST elevated myocardial infarction 01/08/2021   12/2020 - S/p 3 x 28 mm  Synergy DES to prox-mid LAD   OSA on CPAP     Past Surgical History:  Procedure Laterality Date   ARTHROPLASTY     CARDIAC CATHETERIZATION     CARPAL TUNNEL RELEASE Right    CORONARY STENT INTERVENTION N/A 01/09/2021   Procedure: CORONARY STENT INTERVENTION;  Surgeon: Dann Candyce RAMAN, MD;  Location: [facility] INVASIVE CV LAB;  Service: Cardiovascular;  Laterality: N/A;   EYE SURGERY     INTRAVASCULAR IMAGING/OCT N/A 01/09/2021   Procedure: INTRAVASCULAR IMAGING/OCT;  Surgeon: Dann Candyce RAMAN, MD;  Location: [facility] INVASIVE CV LAB;  Service: Cardiovascular;  Laterality: N/A;   JOINT REPLACEMENT     LEFT HEART CATH AND CORONARY ANGIOGRAPHY N/A 01/09/2021   Procedure: LEFT HEART CATH AND CORONARY ANGIOGRAPHY;  Surgeon: Dann Candyce RAMAN, MD;  Location: [facility] INVASIVE CV LAB;  Service: Cardiovascular;  Laterality: N/A;   SEPTOPLASTY      Current Medications: Current Meds  Medication Sig   acyclovir (ZOVIRAX) 200 MG capsule Take 20 mg by mouth as needed (flare).   aluminum chloride (DRYSOL) 20 % external solution as needed (sweating).   aspirin  EC 81 MG tablet daily.   atorvastatin  (LIPITOR ) 80 MG tablet Take 1 tablet (80 mg total) by mouth daily.   carvedilol  (COREG ) 3.125 MG tablet Take 1 tablet (3.125 mg total) by mouth 2 (  two) times daily with a meal.   cetirizine (ZYRTEC) 10 MG tablet as needed for allergies.   Cholecalciferol 50 MCG (2000 UT) TABS daily at 6 (six) AM.   clobetasol (TEMOVATE) 0.05 % external solution Apply 1 application topically as needed (scalp).   fluorometholone (FML) 0.1 % ophthalmic suspension Place 1 drop into both eyes as needed (irritation).   hydrOXYzine  (ATARAX /VISTARIL ) 25 MG tablet Take 25 mg by mouth as needed for anxiety.   levothyroxine  (SYNTHROID ) 137 MCG tablet Take 137 mcg by mouth at bedtime.   losartan  (COZAAR ) 25 MG tablet Take 1 tablet (25 mg total) by mouth daily.   nitroGLYCERIN  (NITROSTAT ) 0.4 MG SL tablet Place 1 tablet (0.4 mg total)  under the tongue every 5 (five) minutes x 3 doses as needed for chest pain.   sertraline  (ZOLOFT ) 100 MG tablet Take 100 mg by mouth daily. Take 1.5 Tablets at Bedtime   timolol  (TIMOPTIC ) 0.5 % ophthalmic solution Place 1 drop into the left eye 2 (two) times daily.     Allergies:   Brimonidine   Social History   Socioeconomic History   Marital status: Divorced    Spouse name: Not on file   Number of children: Not on file   Years of education: 80   Highest education level: Master's degree (e.g., MA, MS, MEng, MEd, MSW, MBA)  Occupational History   Occupation: Retired  Tobacco Use   Smoking status: Never   Smokeless tobacco: Never  Vaping Use   Vaping Use: Never used  Substance and Sexual Activity   Alcohol use: Not Currently    Alcohol/week: 12.0 standard drinks of alcohol    Types: 12 Cans of beer per week   Drug use: Never   Sexual activity: Not Currently  Other Topics Concern   Not on file  Social History Narrative   Not on file   Social Determinants of Health   Financial Resource Strain: Not on file  Food Insecurity: Not on file  Transportation Needs: Not on file  Physical Activity: Not on file  Stress: Not on file  Social Connections: Not on file     Family History: The patient's family history includes CAD in her father and maternal grandfather.  ROS:   Please see the history of present illness.     All other systems reviewed and are negative.  EKGs/Labs/Other Studies Reviewed:    The following studies were reviewed today:  04/27/21 echo complete - EF 60-65%, trivial MR, mild AR.   LHC 01/09/21 Ost LAD to Prox LAD lesion is 75% stenosed. Mid LAD lesion is 99% stenosed, which was likely the culprit for her ACS. A drug-eluting stent was successfully placed using a SYNERGY XD 3.0X28 covering the entire diseased LAD segment from ostial to mid. The stent was postdilated with a 3.5 mm [place] balloon and optimized with OCT. Post intervention, there is a 0% residual  stenosis. Ost Cx to Prox Cx lesion is 40% stenosed. This stenosis was worsened from plaque shift after postdilatation of the ostial LAD. TIMI-3 flow was maintained throughout. The left ventricular systolic function is normal. LV end diastolic pressure is normal. The left ventricular ejection fraction is 55-65% by visual estimate. There is no aortic valve stenosis.   Continue aggressive secondary prevention.  She needs dual antiplatelet therapy ideally for 12 months.     EKG:  EKG is ordered today.  The ekg ordered today demonstrates sinus bradycardia, HR 50 bpm, persistent with prior EKG tracings.   Recent Labs:  11/12/2022: ALT 29; BUN 14; Creatinine, Ser 0.92; Hemoglobin 15.4; Platelets 183; Potassium 4.7; Sodium 144  Recent Lipid Panel    Component Value Date/Time   CHOL 131 11/12/2022 0837   TRIG 112 11/12/2022 0837   HDL 41 11/12/2022 0837   CHOLHDL 3.2 11/12/2022 0837   CHOLHDL 3.8 01/09/2021 0228   VLDL 34 01/09/2021 0228   LDLCALC 70 11/12/2022 0837     Risk Assessment/Calculations:                Physical Exam:    VS:  BP 110/60   Pulse (!) 50   Ht 5' 4 (1.626 m)   Wt 151 lb (68.5 kg)   SpO2 96%   BMI 25.92 kg/m     Wt Readings from Last 3 Encounters:  11/22/22 151 lb (68.5 kg)  01/05/22 150 lb 6.4 oz (68.2 kg)  05/21/21 141 lb (64 kg)     GEN:  Well nourished, well developed in no acute distress HEENT: Normal NECK: No JVD; No carotid bruits LYMPHATICS: No lymphadenopathy CARDIAC: RRR, no murmurs, rubs, gallops RESPIRATORY:  Clear to auscultation without rales, wheezing or rhonchi  ABDOMEN: Soft, non-tender, non-distended MUSCULOSKELETAL:  No edema; No deformity  SKIN: Warm and dry NEUROLOGIC:  Alert and oriented x 3 PSYCHIATRIC:  Normal affect   ASSESSMENT:    1. CAD S/P percutaneous coronary angioplasty   2. Hyperlipidemia, unspecified hyperlipidemia type   3. Ischemic cardiomyopathy   4. OSA on CPAP    PLAN:    In order of problems  listed above:  CAD- s/p DES was placed covering the entire diseased LAD segment from ostial to mid in April 2022. Stable with no anginal symptoms. No indication for ischemic evaluation.  Continue ASA, Coreg , NTG as needed (has not needed).   Ischemic cardiomyopathy - EF in 2022 35-40% > 60-65% in August 2022. NYHA class I, euvolemic. Continue Coreg , Losartan .   Hyperlipidemia -LDL on 11/12/2022 was 70, continue atorvastatin .   OSA on CPAP - compliant, she does endorse continued daytime sleepiness and is working with the [facility] for titration of her CPAP machine.  Preoperative surgical clearance -  Therefore, based on ACC/AHA guidelines, patient would be at acceptable risk for the planned procedure without further cardiovascular testing. I will route this recommendation to the requesting party via Epic fax function.  According to the Revised Cardiac Risk Index (RCRI), her Perioperative Risk of Major Cardiac Event is (%): 0.9  Her Functional Capacity in METs is: 8.97 according to the [facility] Activity Status Index (DASI).   Regarding her aspirin  therapy, it is ok to hold 5-7 days prior to her colonoscopy and resume at the discretion of the GI MD.  Disposition - return in 1 year.            Medication Adjustments/Labs and Tests Ordered: Current medicines are reviewed at length with the patient today.  Concerns regarding medicines are outlined above.  Orders Placed This Encounter  Procedures   EKG 12-Lead   No orders of the defined types were placed in this encounter.   Patient Instructions  Medication Instructions:  The current medical regimen is effective;  continue present plan and medications as directed. Please refer to the Current Medication list given to you today.  *If you need a refill on your cardiac medications before your next appointment, please call your pharmacy*  Lab Work: NONE If you have labs (blood work) drawn today and your tests are completely normal,  you will  receive your results only by: MyChart Message (if you have MyChart) OR  A paper copy in the mail If you have any lab test that is abnormal or we need to change your treatment, we will call you to review the results.  Follow-Up: At [facility], you and your health needs are our priority.  As part of our continuing mission to provide you with exceptional heart care, we have created designated Provider Care Teams.  These Care Teams include your primary Cardiologist (physician) and Advanced Practice Providers (APPs -  Physician Assistants and Nurse Practitioners) who all work together to provide you with the care you need, when you need it.  We recommend signing up for the patient portal called MyChart.  Sign up information is provided on this After Visit Summary.  MyChart is used to connect with patients for Virtual Visits (Telemedicine).  Patients are able to view lab/test results, encounter notes, upcoming appointments, etc.  Non-urgent messages can be sent to your provider as well.   To learn more about what you can do with MyChart, go to [email or web address].    Your next appointment:   12 month(s)  Provider:   Vinie JAYSON Maxcy, MD  or Josefa Beauvais, FNP        Other Instructions     Signed, Delon JAYSON Hoover, NP  11/22/2022 2:58 PM    [facility] "

## 2022-11-22 ENCOUNTER — Encounter: Payer: Self-pay | Admitting: General Practice

## 2022-11-22 ENCOUNTER — Ambulatory Visit: Payer: Medicare PPO | Attending: General Practice | Admitting: Cardiology

## 2022-11-22 VITALS — BP 110/60 | HR 50 | Ht 64.0 in | Wt 151.0 lb

## 2022-11-22 DIAGNOSIS — Z9861 Coronary angioplasty status: Secondary | ICD-10-CM

## 2022-11-22 DIAGNOSIS — G4733 Obstructive sleep apnea (adult) (pediatric): Secondary | ICD-10-CM

## 2022-11-22 DIAGNOSIS — I255 Ischemic cardiomyopathy: Secondary | ICD-10-CM

## 2022-11-22 DIAGNOSIS — I2510 Atherosclerotic heart disease of native coronary artery without angina pectoris: Secondary | ICD-10-CM

## 2022-11-22 DIAGNOSIS — E785 Hyperlipidemia, unspecified: Secondary | ICD-10-CM

## 2022-11-22 DIAGNOSIS — Z01810 Encounter for preprocedural cardiovascular examination: Secondary | ICD-10-CM

## 2022-11-22 NOTE — Patient Instructions (Signed)
Medication Instructions:  The current medical regimen is effective;  continue present plan and medications as directed. Please refer to the Current Medication list given to you today.  *If you need a refill on your cardiac medications before your next appointment, please call your pharmacy*  Lab Work: NONE If you have labs (blood work) drawn today and your tests are completely normal, you will receive your results only by: [place] (if you have MyChart) OR  A paper copy in the mail If you have any lab test that is abnormal or we need to change your treatment, we will call you to review the results.  Follow-Up: At [facility], you and your health needs are our priority.  As part of our continuing mission to provide you with exceptional heart care, we have created designated Provider Care Teams.  These Care Teams include your primary Cardiologist (physician) and Advanced Practice Providers (APPs -  Physician Assistants and Nurse Practitioners) who all work together to provide you with the care you need, when you need it.  We recommend signing up for the patient portal called "MyChart".  Sign up information is provided on this After Visit Summary.  MyChart is used to connect with patients for Virtual Visits (Telemedicine).  Patients are able to view lab/test results, encounter notes, upcoming appointments, etc.  Non-urgent messages can be sent to your provider as well.   To learn more about what you can do with MyChart, go to [email or web address].    Your next appointment:   12 month(s)  Provider:   Pixie Casino, MD  or Coletta Memos, FNP        Other Instructions

## 2022-12-07 ENCOUNTER — Telehealth: Payer: Self-pay | Admitting: Internal Medicine

## 2022-12-07 NOTE — Telephone Encounter (Signed)
Returned call to [facility].  She has been informed that pt hasn't been on Brilinta since 01/09/2022. She stated she had a feeling it was something like that, and thanked me for updating her and she will make the change on her end.

## 2022-12-07 NOTE — Telephone Encounter (Signed)
See 1/30 encounter:  Tiffany with [facility] is following up. She states the clearance recommendation as been received, but they did not receive any advisement on hold Brilinta. Please advise.   Phone#:  [phone] Fax#:  [phone]

## 2022-12-15 DIAGNOSIS — K648 Other hemorrhoids: Secondary | ICD-10-CM | POA: Diagnosis not present

## 2022-12-15 DIAGNOSIS — Z1211 Encounter for screening for malignant neoplasm of colon: Secondary | ICD-10-CM | POA: Diagnosis not present

## 2022-12-28 DIAGNOSIS — G4733 Obstructive sleep apnea (adult) (pediatric): Secondary | ICD-10-CM | POA: Diagnosis not present

## 2023-01-03 ENCOUNTER — Other Ambulatory Visit: Payer: Self-pay | Admitting: Internal Medicine

## 2023-01-03 NOTE — Telephone Encounter (Signed)
Rx request sent to pharmacy.  

## 2023-01-06 ENCOUNTER — Ambulatory Visit: Payer: Medicare PPO | Admitting: Physician Assistant

## 2023-01-24 ENCOUNTER — Other Ambulatory Visit: Payer: Self-pay | Admitting: Family Medicine

## 2023-01-24 ENCOUNTER — Ambulatory Visit
Admission: RE | Admit: 2023-01-24 | Discharge: 2023-01-24 | Disposition: A | Discharge status: Home / Self Care | Payer: Medicare PPO | Source: Ambulatory Visit | Attending: Family Medicine | Admitting: Family Medicine

## 2023-01-24 DIAGNOSIS — Z1231 Encounter for screening mammogram for malignant neoplasm of breast: Secondary | ICD-10-CM | POA: Diagnosis not present

## 2023-02-17 DIAGNOSIS — M7742 Metatarsalgia, left foot: Secondary | ICD-10-CM | POA: Diagnosis not present

## 2023-02-17 DIAGNOSIS — G5762 Lesion of plantar nerve, left lower limb: Secondary | ICD-10-CM | POA: Diagnosis not present

## 2023-02-17 DIAGNOSIS — T8484XA Pain due to internal orthopedic prosthetic devices, implants and grafts, initial encounter: Secondary | ICD-10-CM | POA: Diagnosis not present

## 2023-02-22 DIAGNOSIS — H524 Presbyopia: Secondary | ICD-10-CM | POA: Diagnosis not present

## 2023-02-22 DIAGNOSIS — G514 Facial myokymia: Secondary | ICD-10-CM | POA: Diagnosis not present

## 2023-02-22 DIAGNOSIS — H52203 Unspecified astigmatism, bilateral: Secondary | ICD-10-CM | POA: Diagnosis not present

## 2023-02-22 DIAGNOSIS — H5213 Myopia, bilateral: Secondary | ICD-10-CM | POA: Diagnosis not present

## 2023-02-22 DIAGNOSIS — H401233 Low-tension glaucoma, bilateral, severe stage: Secondary | ICD-10-CM | POA: Diagnosis not present

## 2023-03-29 DIAGNOSIS — G4733 Obstructive sleep apnea (adult) (pediatric): Secondary | ICD-10-CM | POA: Diagnosis not present

## 2023-05-03 DIAGNOSIS — E039 Hypothyroidism, unspecified: Secondary | ICD-10-CM | POA: Diagnosis not present

## 2023-05-03 DIAGNOSIS — I10 Essential (primary) hypertension: Secondary | ICD-10-CM | POA: Diagnosis not present

## 2023-05-03 DIAGNOSIS — G4733 Obstructive sleep apnea (adult) (pediatric): Secondary | ICD-10-CM | POA: Diagnosis not present

## 2023-06-27 DIAGNOSIS — G4733 Obstructive sleep apnea (adult) (pediatric): Secondary | ICD-10-CM | POA: Diagnosis not present

## 2023-06-30 DIAGNOSIS — T8484XA Pain due to internal orthopedic prosthetic devices, implants and grafts, initial encounter: Secondary | ICD-10-CM | POA: Diagnosis not present

## 2023-06-30 DIAGNOSIS — G5762 Lesion of plantar nerve, left lower limb: Secondary | ICD-10-CM | POA: Diagnosis not present

## 2023-07-06 DIAGNOSIS — H35413 Lattice degeneration of retina, bilateral: Secondary | ICD-10-CM | POA: Diagnosis not present

## 2023-07-06 DIAGNOSIS — H401233 Low-tension glaucoma, bilateral, severe stage: Secondary | ICD-10-CM | POA: Diagnosis not present

## 2023-07-18 DIAGNOSIS — R5383 Other fatigue: Secondary | ICD-10-CM | POA: Diagnosis not present

## 2023-07-18 DIAGNOSIS — E039 Hypothyroidism, unspecified: Secondary | ICD-10-CM | POA: Diagnosis not present

## 2023-07-18 DIAGNOSIS — Z6826 Body mass index (BMI) 26.0-26.9, adult: Secondary | ICD-10-CM | POA: Diagnosis not present

## 2023-07-18 DIAGNOSIS — Z0000 Encounter for general adult medical examination without abnormal findings: Secondary | ICD-10-CM | POA: Diagnosis not present

## 2023-07-18 DIAGNOSIS — Z9181 History of falling: Secondary | ICD-10-CM | POA: Diagnosis not present

## 2023-07-18 DIAGNOSIS — F3340 Major depressive disorder, recurrent, in remission, unspecified: Secondary | ICD-10-CM | POA: Diagnosis not present

## 2023-07-18 DIAGNOSIS — M8580 Other specified disorders of bone density and structure, unspecified site: Secondary | ICD-10-CM | POA: Diagnosis not present

## 2023-07-18 DIAGNOSIS — Z79899 Other long term (current) drug therapy: Secondary | ICD-10-CM | POA: Diagnosis not present

## 2023-07-18 DIAGNOSIS — I2510 Atherosclerotic heart disease of native coronary artery without angina pectoris: Secondary | ICD-10-CM | POA: Diagnosis not present

## 2023-09-26 DIAGNOSIS — G4733 Obstructive sleep apnea (adult) (pediatric): Secondary | ICD-10-CM | POA: Diagnosis not present

## 2023-11-02 DIAGNOSIS — Z6827 Body mass index (BMI) 27.0-27.9, adult: Secondary | ICD-10-CM | POA: Diagnosis not present

## 2023-11-02 DIAGNOSIS — F5101 Primary insomnia: Secondary | ICD-10-CM | POA: Diagnosis not present

## 2023-11-02 DIAGNOSIS — F3340 Major depressive disorder, recurrent, in remission, unspecified: Secondary | ICD-10-CM | POA: Diagnosis not present

## 2023-11-02 DIAGNOSIS — H903 Sensorineural hearing loss, bilateral: Secondary | ICD-10-CM | POA: Diagnosis not present

## 2023-11-02 DIAGNOSIS — H9313 Tinnitus, bilateral: Secondary | ICD-10-CM | POA: Diagnosis not present

## 2023-11-02 DIAGNOSIS — R053 Chronic cough: Secondary | ICD-10-CM | POA: Diagnosis not present

## 2023-11-02 DIAGNOSIS — E039 Hypothyroidism, unspecified: Secondary | ICD-10-CM | POA: Diagnosis not present

## 2023-11-10 DIAGNOSIS — H35413 Lattice degeneration of retina, bilateral: Secondary | ICD-10-CM | POA: Diagnosis not present

## 2023-11-10 DIAGNOSIS — H401233 Low-tension glaucoma, bilateral, severe stage: Secondary | ICD-10-CM | POA: Diagnosis not present

## 2023-11-10 DIAGNOSIS — E0500 Thyrotoxicosis with diffuse goiter without thyrotoxic crisis or storm: Secondary | ICD-10-CM | POA: Diagnosis not present

## 2023-11-18 ENCOUNTER — Ambulatory Visit: Payer: Medicare PPO | Attending: Internal Medicine | Admitting: Internal Medicine

## 2023-11-18 VITALS — BP 108/62 | HR 56 | Ht 64.0 in | Wt 158.0 lb

## 2023-11-18 DIAGNOSIS — G4733 Obstructive sleep apnea (adult) (pediatric): Secondary | ICD-10-CM

## 2023-11-18 DIAGNOSIS — I25119 Atherosclerotic heart disease of native coronary artery with unspecified angina pectoris: Secondary | ICD-10-CM

## 2023-11-18 DIAGNOSIS — E785 Hyperlipidemia, unspecified: Secondary | ICD-10-CM | POA: Diagnosis not present

## 2023-11-18 DIAGNOSIS — I255 Ischemic cardiomyopathy: Secondary | ICD-10-CM

## 2023-11-18 NOTE — Progress Notes (Signed)
 OFFICE NOTE  Chief Complaint:  Follow-up   Primary Care Physician: Sigmund Hazel, MD  HPI:  Debbie Frazier is a 68 y.o. female with a past medial history significant for CAD s/p NSTEMI in April 2022.  Cath showed 75% proximal LAD and 99% mid LAD status post PCI.  There was 40% ostial circumflex disease as well.  Heart function at the time was reduced with EF 35 to 40%.  She also has a history of OSA on CPAP, depression, dyslipidemia and prior moderate alcohol use.  Since then she has felt much better.  She participated in cardiac rehab.  She is noted marked improvement in her symptoms.  A repeat echo was just performed which showed LVEF now up to 60 to 65% with mild aortic insufficiency.  She is completely asymptomatic.  She is having no issues with significant bruising or bleeding on aspirin and Brilinta.  She is scheduled for repeat CPAP titration next week.  Lipids are very well controlled with total cholesterol in July of 103, HDL 38, LDL 49 and triglycerides 75 on 80 mg atorvastatin.  She is also on aspirin, carvedilol and losartan.  Brilinta 90 mg twice daily.  Blood pressure was 104/60 today.  At times it runs lower however she is asymptomatic with this.  She is also managed to cut back her alcohol use significantly almost completely out which I think is also been beneficial for her.  11/18/2023  Debbie Frazier is seen today in follow-up.  She was last seen by Wallis Bamberg, NP in 2024.  Over the past year she has stopped her carvedilol.  She was having some fatigue, worsening depression and lethargy with it.  Today her blood pressure was 108/62 with a heart rate of 56 so I suspect it was not tolerated because of those reasons.  Her LVEF had recovered previously.  She denies any chest pain or shortness of breath.  She reports compliance with CPAP.  She has not had a recent lipid profile but her last LDL in March 2024 was 70.  She is fasting today.  PMHx:  Past Medical History:   Diagnosis Date   Coronary Artery Disease 01/10/2021   NSTEMI 12/2020 - Cath: pLAD 75, mLAD 99 >> s/p DES; oLCx 40   Depression    HFrEF (heart failure with reduced ejection fraction)    Ischemic CM // Echo 4/22: apical / apical ant / apical septal AK; EF 35-40, mild asymmetric LVH, Gr 2 DD, normal RVSF, mild LAE, trivial MR, mild AI   High cholesterol    Hypothyroidism    Non-ST elevated myocardial infarction 01/08/2021   12/2020 - S/p 3 x 28 mm Synergy DES to prox-mid LAD   OSA on CPAP     Past Surgical History:  Procedure Laterality Date   ARTHROPLASTY     CARDIAC CATHETERIZATION     CARPAL TUNNEL RELEASE Right    CORONARY IMAGING/OCT N/A 01/09/2021   Procedure: INTRAVASCULAR IMAGING/OCT;  Surgeon: Corky Crafts, MD;  Location: [facility] INVASIVE CV LAB;  Service: Cardiovascular;  Laterality: N/A;   CORONARY STENT INTERVENTION N/A 01/09/2021   Procedure: CORONARY STENT INTERVENTION;  Surgeon: Corky Crafts, MD;  [facility] INVASIVE CV LAB;  Service: Cardiovascular;  Laterality: N/A;   EYE SURGERY     JOINT REPLACEMENT     LEFT HEART CATH AND CORONARY ANGIOGRAPHY N/A 01/09/2021   Procedure: LEFT HEART CATH AND CORONARY ANGIOGRAPHY;  Surgeon: Corky Crafts, MD;  Location: [facility] INVASIVE  CV LAB;  Service: Cardiovascular;  Laterality: N/A;   SEPTOPLASTY      FAMHx:  Family History  Problem Relation Age of Onset   CAD Father    CAD Maternal Grandfather     SOCHx:   reports that she has never smoked. She has never used smokeless tobacco. She reports that she does not currently use alcohol after a past usage of about 12.0 standard drinks of alcohol per week. She reports that she does not use drugs.  ALLERGIES:  Allergies  Allergen Reactions   Brimonidine Other (See Comments)    Pt was not able to tolerate     ROS: Pertinent items noted in HPI and remainder of comprehensive ROS otherwise negative.  HOME MEDS: Current Outpatient Medications on File Prior to  Visit  Medication Sig Dispense Refill   acyclovir (ZOVIRAX) 200 MG capsule Take 20 mg by mouth as needed (flare).     aluminum chloride (DRYSOL) 20 % external solution as needed (sweating).     aspirin EC 81 MG tablet daily.     atorvastatin (LIPITOR) 80 MG tablet Take 1 tablet (80 mg total) by mouth daily. 90 tablet 3   carvedilol (COREG) 3.125 MG tablet Take 1 tablet (3.125 mg total) by mouth 2 (two) times daily with a meal. 180 tablet 3   cetirizine (ZYRTEC) 10 MG tablet as needed for allergies.     Cholecalciferol 50 MCG (2000 UT) TABS daily at 6 (six) AM.     clobetasol (TEMOVATE) 0.05 % external solution Apply 1 application topically as needed (scalp).     fluorometholone (FML) 0.1 % ophthalmic suspension Place 1 drop into both eyes as needed (irritation).     hydrOXYzine (ATARAX/VISTARIL) 25 MG tablet Take 25 mg by mouth as needed for anxiety.     levothyroxine (SYNTHROID) 137 MCG tablet Take 137 mcg by mouth at bedtime.     losartan (COZAAR) 25 MG tablet Take 1 tablet (25 mg total) by mouth daily. 90 tablet 3   sertraline (ZOLOFT) 100 MG tablet Take 100 mg by mouth daily. Take 1.5 Tablets at Bedtime     timolol (TIMOPTIC) 0.5 % ophthalmic solution Place 1 drop into the left eye 2 (two) times daily.     nitroGLYCERIN (NITROSTAT) 0.4 MG SL tablet Place 1 tablet (0.4 mg total) under the tongue every 5 (five) minutes x 3 doses as needed for chest pain. 25 tablet 11   No current facility-administered medications on file prior to visit.    LABS/IMAGING: No results found for this or any previous visit (from the past 48 hours). No results found.  LIPID PANEL:    Component Value Date/Time   CHOL 131 11/12/2022 0837   TRIG 112 11/12/2022 0837   HDL 41 11/12/2022 0837   CHOLHDL 3.2 11/12/2022 0837   CHOLHDL 3.8 01/09/2021 0228   VLDL 34 01/09/2021 0228   LDLCALC 70 11/12/2022 0837     WEIGHTS: Wt Readings from Last 3 Encounters:  11/18/23 158 lb (71.7 kg)  11/22/22 151 lb (68.5  kg)  01/05/22 150 lb 6.4 oz (68.2 kg)    VITALS: BP 108/62 (BP Location: Left Arm, Patient Position: Sitting, Cuff Size: Normal)   Pulse (!) 56   Ht 5\' 4"  (1.626 m)   Wt 158 lb (71.7 kg)   SpO2 99%   BMI 27.12 kg/m   EXAM: General appearance: alert and no distress Neck: no carotid bruit, no JVD, and thyroid not enlarged, symmetric, no tenderness/mass/nodules Lungs: clear to  auscultation bilaterally Heart: regular rate and rhythm, S1, S2 normal, no murmur, click, rub or gallop Abdomen: soft, non-tender; bowel sounds normal; no masses,  no organomegaly Extremities: extremities normal, atraumatic, no cyanosis or edema Pulses: 2+ and symmetric Skin: Skin color, texture, turgor normal. No rashes or lesions Neurologic: Grossly normal Pleasant  EKG: EKG Interpretation Date/Time:  Friday November 18 2023 09:03:27 EST Ventricular Rate:  56 PR Interval:  152 QRS Duration:  82 QT Interval:  428 QTC Calculation: 413 R Axis:   43  Text Interpretation: Sinus bradycardia When compared with ECG of 10-Jan-2021 06:09, Non-specific change in ST segment in Inferior leads T wave inversion no longer evident in Inferior leads T wave inversion no longer evident in Anterolateral leads QT has shortened Confirmed by Zoila Shutter [phone]) on 11/18/2023 9:12:31 AM    ASSESSMENT: CAD with NSTEMI, status post PCI to the mid LAD and some moderate residual disease of the ostial circumflex (12/2020)  Ischemic cardiomyopathy LVEF 35 to 40%-now improved to 60 to 65% with mild AI (04/2021) Dyslipidemia, goal LDL <70 OSA on CPAP  PLAN: 1.   Ms. Janota seems to be doing well without chest pain or worsening shortness of breath.  She is due for repeat lipids which we will obtain today.  Her EF had normalized by echo and she is on some losartan.  Carvedilol was stopped due to intolerance and is noted her blood pressure is in the low normal range with heart rate in the 50s today off of it.  She is compliant with  CPAP.  No changes to her medicines today.  Plan follow-up with me or APP annually or sooner as necessary.  Chrystie Nose, MD, [facility], FACP  [place]  [facility]  Medical Director of the Advanced Lipid Disorders &  Cardiovascular Risk Reduction Clinic Diplomate of the American Board of Clinical Lipidology Attending Cardiologist  Direct Dial: [phone]  Fax: [phone]  Website:  [email or web address]   Lisette Abu Margarethe Virgen 11/18/2023, 9:12 AM

## 2023-11-18 NOTE — Patient Instructions (Signed)
 Medication Instructions:  NO CHANGES  *If you need a refill on your cardiac medications before your next appointment, please call your pharmacy*   Lab Work: Lipid Panel and LPa today   If you have labs (blood work) drawn today and your tests are completely normal, you will receive your results only by: MyChart Message (if you have MyChart) OR A paper copy in the mail If you have any lab test that is abnormal or we need to change your treatment, we will call you to review the results.   Follow-Up: At [facility], you and your health needs are our priority.  As part of our continuing mission to provide you with exceptional heart care, we have created designated Provider Care Teams.  These Care Teams include your primary Cardiologist (physician) and Advanced Practice Providers (APPs -  Physician Assistants and Nurse Practitioners) who all work together to provide you with the care you need, when you need it.  We recommend signing up for the patient portal called "MyChart".  Sign up information is provided on this After Visit Summary.  MyChart is used to connect with patients for Virtual Visits (Telemedicine).  Patients are able to view lab/test results, encounter notes, upcoming appointments, etc.  Non-urgent messages can be sent to your provider as well.   To learn more about what you can do with MyChart, go to [email or web address].    Your next appointment:   12 months with Dr. Rennis Golden or NP/PA  Other Instructions   1st Floor: - Lobby - Registration  - Pharmacy  - Lab - Cafe  2nd Floor: - PV Lab - Diagnostic Testing (echo, CT, nuclear med)  3rd Floor: - Vacant  4th Floor: - TCTS (cardiothoracic surgery) - AFib Clinic - Structural Heart Clinic - Vascular Surgery  - Vascular Ultrasound  5th Floor: - HeartCare Cardiology (general and EP) - Clinical Pharmacy for coumadin, hypertension, lipid, weight-loss medications, and med management appointments    Valet  parking services will be available as well.

## 2023-11-20 LAB — LIPOPROTEIN A (LPA): Lipoprotein (a): 31.5 nmol/L (ref <75.0)

## 2023-11-20 LAB — NMR, LIPOPROFILE
Cholesterol, Total: 159 mg/dL (ref 100–199)
HDL Particle Number: 36.1 umol/L (ref >=30.5)
HDL-C: 46 mg/dL (ref >39)
LDL Particle Number: 1146 nmol/L — ABNORMAL HIGH (ref <1000)
LDL Size: 20.6 nm (ref >20.5)
LDL-C (NIH Calc): 89 mg/dL (ref 0–99)
LP-IR Score: 63 — ABNORMAL HIGH (ref <=45)
Small LDL Particle Number: 638 nmol/L — ABNORMAL HIGH (ref <=527)
Triglycerides: 137 mg/dL (ref 0–149)

## 2023-11-22 ENCOUNTER — Other Ambulatory Visit: Payer: Self-pay | Admitting: *Deleted

## 2023-11-22 DIAGNOSIS — E785 Hyperlipidemia, unspecified: Secondary | ICD-10-CM

## 2023-12-30 DIAGNOSIS — G4733 Obstructive sleep apnea (adult) (pediatric): Secondary | ICD-10-CM | POA: Diagnosis not present

## 2024-01-02 ENCOUNTER — Other Ambulatory Visit: Payer: Self-pay | Admitting: Internal Medicine

## 2024-01-23 DIAGNOSIS — M25552 Pain in left hip: Secondary | ICD-10-CM | POA: Diagnosis not present

## 2024-01-23 DIAGNOSIS — M25512 Pain in left shoulder: Secondary | ICD-10-CM | POA: Diagnosis not present

## 2024-01-31 DIAGNOSIS — M25512 Pain in left shoulder: Secondary | ICD-10-CM | POA: Diagnosis not present

## 2024-02-20 DIAGNOSIS — M25512 Pain in left shoulder: Secondary | ICD-10-CM | POA: Diagnosis not present

## 2024-02-20 DIAGNOSIS — M25532 Pain in left wrist: Secondary | ICD-10-CM | POA: Diagnosis not present

## 2024-02-28 DIAGNOSIS — M25522 Pain in left elbow: Secondary | ICD-10-CM | POA: Diagnosis not present

## 2024-02-28 DIAGNOSIS — M25532 Pain in left wrist: Secondary | ICD-10-CM | POA: Diagnosis not present

## 2024-02-28 DIAGNOSIS — M1812 Unilateral primary osteoarthritis of first carpometacarpal joint, left hand: Secondary | ICD-10-CM | POA: Diagnosis not present

## 2024-03-06 ENCOUNTER — Encounter (HOSPITAL_BASED_OUTPATIENT_CLINIC_OR_DEPARTMENT_OTHER): Payer: Self-pay | Admitting: Internal Medicine

## 2024-03-06 NOTE — Telephone Encounter (Signed)
 Sorry- don't know much about stents, please advise

## 2024-03-11 DIAGNOSIS — M25512 Pain in left shoulder: Secondary | ICD-10-CM | POA: Diagnosis not present

## 2024-03-20 DIAGNOSIS — S46012A Strain of muscle(s) and tendon(s) of the rotator cuff of left shoulder, initial encounter: Secondary | ICD-10-CM | POA: Diagnosis not present

## 2024-03-27 DIAGNOSIS — H401233 Low-tension glaucoma, bilateral, severe stage: Secondary | ICD-10-CM | POA: Diagnosis not present

## 2024-03-29 DIAGNOSIS — G4733 Obstructive sleep apnea (adult) (pediatric): Secondary | ICD-10-CM | POA: Diagnosis not present

## 2024-04-19 ENCOUNTER — Encounter: Payer: Self-pay | Admitting: Internal Medicine

## 2024-04-24 DIAGNOSIS — E039 Hypothyroidism, unspecified: Secondary | ICD-10-CM | POA: Diagnosis not present

## 2024-04-24 DIAGNOSIS — F3340 Major depressive disorder, recurrent, in remission, unspecified: Secondary | ICD-10-CM | POA: Diagnosis not present

## 2024-04-24 DIAGNOSIS — F419 Anxiety disorder, unspecified: Secondary | ICD-10-CM | POA: Diagnosis not present

## 2024-04-24 DIAGNOSIS — E7800 Pure hypercholesterolemia, unspecified: Secondary | ICD-10-CM | POA: Diagnosis not present

## 2024-04-24 DIAGNOSIS — Z0000 Encounter for general adult medical examination without abnormal findings: Secondary | ICD-10-CM | POA: Diagnosis not present

## 2024-05-04 ENCOUNTER — Other Ambulatory Visit: Payer: Self-pay | Admitting: *Deleted

## 2024-05-04 DIAGNOSIS — E785 Hyperlipidemia, unspecified: Secondary | ICD-10-CM

## 2024-05-28 DIAGNOSIS — G4733 Obstructive sleep apnea (adult) (pediatric): Secondary | ICD-10-CM | POA: Diagnosis not present

## 2024-05-28 DIAGNOSIS — F329 Major depressive disorder, single episode, unspecified: Secondary | ICD-10-CM | POA: Diagnosis not present

## 2024-06-27 DIAGNOSIS — L814 Other melanin hyperpigmentation: Secondary | ICD-10-CM | POA: Diagnosis not present

## 2024-06-27 DIAGNOSIS — L720 Epidermal cyst: Secondary | ICD-10-CM | POA: Diagnosis not present

## 2024-06-27 DIAGNOSIS — L570 Actinic keratosis: Secondary | ICD-10-CM | POA: Diagnosis not present

## 2024-06-27 DIAGNOSIS — L309 Dermatitis, unspecified: Secondary | ICD-10-CM | POA: Diagnosis not present

## 2024-06-27 DIAGNOSIS — L821 Other seborrheic keratosis: Secondary | ICD-10-CM | POA: Diagnosis not present

## 2024-07-26 DIAGNOSIS — M19012 Primary osteoarthritis, left shoulder: Secondary | ICD-10-CM | POA: Diagnosis not present

## 2024-07-26 DIAGNOSIS — M7502 Adhesive capsulitis of left shoulder: Secondary | ICD-10-CM | POA: Diagnosis not present

## 2024-07-26 DIAGNOSIS — S46012D Strain of muscle(s) and tendon(s) of the rotator cuff of left shoulder, subsequent encounter: Secondary | ICD-10-CM | POA: Diagnosis not present

## 2024-07-26 DIAGNOSIS — M25512 Pain in left shoulder: Secondary | ICD-10-CM | POA: Diagnosis not present

## 2024-07-30 DIAGNOSIS — M25512 Pain in left shoulder: Secondary | ICD-10-CM | POA: Diagnosis not present

## 2024-07-30 DIAGNOSIS — M25612 Stiffness of left shoulder, not elsewhere classified: Secondary | ICD-10-CM | POA: Diagnosis not present

## 2024-07-30 DIAGNOSIS — G8929 Other chronic pain: Secondary | ICD-10-CM | POA: Diagnosis not present

## 2024-08-01 DIAGNOSIS — G8929 Other chronic pain: Secondary | ICD-10-CM | POA: Diagnosis not present

## 2024-08-01 DIAGNOSIS — M25612 Stiffness of left shoulder, not elsewhere classified: Secondary | ICD-10-CM | POA: Diagnosis not present

## 2024-08-01 DIAGNOSIS — M25512 Pain in left shoulder: Secondary | ICD-10-CM | POA: Diagnosis not present

## 2024-08-02 DIAGNOSIS — G4733 Obstructive sleep apnea (adult) (pediatric): Secondary | ICD-10-CM | POA: Diagnosis not present

## 2024-08-03 DIAGNOSIS — H401233 Low-tension glaucoma, bilateral, severe stage: Secondary | ICD-10-CM | POA: Diagnosis not present

## 2024-08-06 DIAGNOSIS — G8929 Other chronic pain: Secondary | ICD-10-CM | POA: Diagnosis not present

## 2024-08-06 DIAGNOSIS — M25612 Stiffness of left shoulder, not elsewhere classified: Secondary | ICD-10-CM | POA: Diagnosis not present

## 2024-08-06 DIAGNOSIS — M25512 Pain in left shoulder: Secondary | ICD-10-CM | POA: Diagnosis not present

## 2024-08-08 DIAGNOSIS — G8929 Other chronic pain: Secondary | ICD-10-CM | POA: Diagnosis not present

## 2024-08-08 DIAGNOSIS — M25512 Pain in left shoulder: Secondary | ICD-10-CM | POA: Diagnosis not present

## 2024-08-08 DIAGNOSIS — M25612 Stiffness of left shoulder, not elsewhere classified: Secondary | ICD-10-CM | POA: Diagnosis not present
